# Patient Record
Sex: Male | Born: 1955
Health system: Southern US, Community
[De-identification: ages and names within clinical notes are randomized; demographics above are authoritative.]

## PROBLEM LIST (undated history)

## (undated) DIAGNOSIS — I10 Essential (primary) hypertension: Secondary | ICD-10-CM

## (undated) DIAGNOSIS — J40 Bronchitis, not specified as acute or chronic: Secondary | ICD-10-CM

## (undated) DIAGNOSIS — M1990 Unspecified osteoarthritis, unspecified site: Secondary | ICD-10-CM

## (undated) DIAGNOSIS — E785 Hyperlipidemia, unspecified: Secondary | ICD-10-CM

## (undated) DIAGNOSIS — E119 Type 2 diabetes mellitus without complications: Secondary | ICD-10-CM

## (undated) DIAGNOSIS — J45909 Unspecified asthma, uncomplicated: Secondary | ICD-10-CM

## (undated) DIAGNOSIS — I219 Acute myocardial infarction, unspecified: Secondary | ICD-10-CM

## (undated) DIAGNOSIS — Z720 Tobacco use: Secondary | ICD-10-CM

## (undated) DIAGNOSIS — Z86010 Personal history of colonic polyps: Principal | ICD-10-CM

## (undated) HISTORY — DX: Personal history of colonic polyps: Z86.010

## (undated) HISTORY — DX: Hyperlipidemia, unspecified: E78.5

## (undated) HISTORY — DX: Unspecified asthma, uncomplicated: J45.909

## (undated) HISTORY — DX: Type 2 diabetes mellitus without complications: E11.9

## (undated) HISTORY — PX: APPENDECTOMY: SHX54

## (undated) HISTORY — DX: Tobacco use: Z72.0

## (undated) HISTORY — PX: OTHER SURGICAL HISTORY: SHX169

## (undated) HISTORY — DX: Unspecified osteoarthritis, unspecified site: M19.90

---

## 1992-11-02 DIAGNOSIS — I219 Acute myocardial infarction, unspecified: Secondary | ICD-10-CM

## 1992-11-02 HISTORY — DX: Acute myocardial infarction, unspecified: I21.9

## 2008-09-07 HISTORY — PX: COLONOSCOPY: SHX174

## 2013-03-20 ENCOUNTER — Encounter (HOSPITAL_COMMUNITY): Payer: Self-pay | Admitting: Emergency Medicine

## 2013-03-20 ENCOUNTER — Emergency Department (HOSPITAL_COMMUNITY): Payer: Medicare (Managed Care)

## 2013-03-20 ENCOUNTER — Emergency Department (HOSPITAL_COMMUNITY)
Admission: EM | Admit: 2013-03-20 | Discharge: 2013-03-20 | Disposition: A | Discharge status: Home / Self Care | Payer: Medicare (Managed Care) | Attending: Emergency Medicine | Admitting: Emergency Medicine

## 2013-03-20 DIAGNOSIS — E119 Type 2 diabetes mellitus without complications: Secondary | ICD-10-CM | POA: Insufficient documentation

## 2013-03-20 DIAGNOSIS — F17200 Nicotine dependence, unspecified, uncomplicated: Secondary | ICD-10-CM | POA: Insufficient documentation

## 2013-03-20 DIAGNOSIS — R059 Cough, unspecified: Secondary | ICD-10-CM | POA: Insufficient documentation

## 2013-03-20 DIAGNOSIS — I252 Old myocardial infarction: Secondary | ICD-10-CM | POA: Insufficient documentation

## 2013-03-20 DIAGNOSIS — I10 Essential (primary) hypertension: Secondary | ICD-10-CM | POA: Insufficient documentation

## 2013-03-20 DIAGNOSIS — R05 Cough: Secondary | ICD-10-CM | POA: Insufficient documentation

## 2013-03-20 DIAGNOSIS — R079 Chest pain, unspecified: Secondary | ICD-10-CM | POA: Insufficient documentation

## 2013-03-20 DIAGNOSIS — Z8709 Personal history of other diseases of the respiratory system: Secondary | ICD-10-CM | POA: Insufficient documentation

## 2013-03-20 HISTORY — DX: Type 2 diabetes mellitus without complications: E11.9

## 2013-03-20 HISTORY — DX: Essential (primary) hypertension: I10

## 2013-03-20 HISTORY — DX: Acute myocardial infarction, unspecified: I21.9

## 2013-03-20 HISTORY — DX: Bronchitis, not specified as acute or chronic: J40

## 2013-03-20 LAB — COMPREHENSIVE METABOLIC PANEL
AST: 26 U/L (ref 0–37)
Albumin: 3.8 g/dL (ref 3.5–5.2)
Calcium: 9.3 mg/dL (ref 8.4–10.5)
Chloride: 104 mEq/L (ref 96–112)
Creatinine, Ser: 1.22 mg/dL (ref 0.50–1.35)
Total Bilirubin: 0.3 mg/dL (ref 0.3–1.2)
Total Protein: 6.7 g/dL (ref 6.0–8.3)

## 2013-03-20 LAB — POCT I-STAT TROPONIN I

## 2013-03-20 LAB — PRO B NATRIURETIC PEPTIDE: Pro B Natriuretic peptide (BNP): 10.4 pg/mL (ref 0–125)

## 2013-03-20 LAB — CBC
Hemoglobin: 14.1 g/dL (ref 13.0–17.0)
MCHC: 35.2 g/dL (ref 30.0–36.0)

## 2013-03-20 LAB — PROTIME-INR
INR: 0.94 (ref 0.00–1.49)
Prothrombin Time: 12.5 seconds (ref 11.6–15.2)

## 2013-03-20 MED ORDER — ASPIRIN 81 MG PO CHEW
324.0000 mg | CHEWABLE_TABLET | Freq: Once | ORAL | Status: AC
  Administered 2013-03-20: 324 mg via ORAL
  Filled 2013-03-20: qty 4

## 2013-03-20 MED ORDER — PREDNISONE 20 MG PO TABS
60.0000 mg | ORAL_TABLET | ORAL | Status: AC
  Administered 2013-03-20: 60 mg via ORAL
  Filled 2013-03-20: qty 3

## 2013-03-20 MED ORDER — PREDNISONE 20 MG PO TABS: 60.0000 mg | ORAL_TABLET | Freq: Every day | ORAL | Status: DC

## 2013-03-20 NOTE — ED Provider Notes (Signed)
History     CSN: [id number]  Arrival date & time 03/20/13  1145   First MD Initiated Contact with Patient 03/20/13 1151      Chief Complaint  Patient presents with  . Chest Pain    (Consider location/radiation/quality/duration/timing/severity/associated sxs/prior treatment) HPI Patient presents with episodic chest pain for 3 days. Also was at rest 3 days ago.  Since onset has been intermittent, left-sided, sharp, without clear precipitant or alleviating factors. The pain radiates to the left neck, left arm. There is no associated exertional or pleuritic pain There is no associated dyspnea, lightheadedness, nausea, vomiting, diarrhea, fever, chills. The patient is mild cough. Patient smokes daily. He is counseled to stop smoking. He states that he has a history of 2 prior MI, though no prior catheterization. This occurred in [place].    Past Medical History  Diagnosis Date  . Hypertension   . Diabetes mellitus without complication   . Acute MI   . Bronchitis     Past Surgical History  Procedure Laterality Date  . Appendectomy    . Cyst removed off back      No family history on file.  History  Substance Use Topics  . Smoking status: Current Every Day Smoker    Types: Cigarettes  . Smokeless tobacco: Not on file  . Alcohol Use: Yes     Comment: occasionall beer      Review of Systems  Constitutional:       Per HPI, otherwise negative  HENT:       Per HPI, otherwise negative  Respiratory:       Per HPI, otherwise negative  Cardiovascular:       Per HPI, otherwise negative  Gastrointestinal: Negative for vomiting.  Endocrine:       Negative aside from HPI  Genitourinary:       Neg aside from HPI   Musculoskeletal:       Per HPI, otherwise negative  Skin: Negative.   Neurological: Negative for syncope.    Allergies  Review of patient's allergies indicates no known allergies.  Home Medications  No current outpatient prescriptions on  file.  BP 150/93  Pulse 69  Temp(Src) 97.6 F (36.4 C) (Oral)  Resp 24  SpO2 99%  Physical Exam  Nursing note and vitals reviewed. Constitutional: He is oriented to person, place, and time. He appears well-developed. No distress.  HENT:  Head: Normocephalic and atraumatic.  Eyes: Conjunctivae and EOM are normal.  Cardiovascular: Normal rate and regular rhythm.   Pulmonary/Chest: Effort normal. No stridor. No respiratory distress.  Abdominal: He exhibits no distension.  Musculoskeletal: He exhibits no edema.  Neurological: He is alert and oriented to person, place, and time.  Skin: Skin is warm and dry.  Psychiatric: He has a normal mood and affect.    ED Course  Procedures (including critical care time)  Labs Reviewed  CBC  PROTIME-INR  PRO B NATRIURETIC PEPTIDE  COMPREHENSIVE METABOLIC PANEL  POCT I-STAT TROPONIN I   No results found.   No diagnosis found.  Pulse ox 99% room air normal   Date: 03/20/2013  Rate: 68  Rhythm: normal sinus rhythm  QRS Axis: normal  Intervals: normal  ST/T Wave abnormalities: normal  Conduction Disutrbances: none  Narrative Interpretation: unremarkable  Cardiac: 65sr, normal  3:09 PM Patient home.  Vital signs remained stable.  Reviewed labs thus far, x-ray with the patient.  I reiterated the importance of smoking cessation to  MDM  This patient  presents with days of intermittent chest pain, mild cough.  On exam is awake and alert, hemodynamically stable, in no distress.  The patient's evaluation was largely reassuring, including nonischemic ECG, normal troponin, which was his description of days of chest pain likely reflects the absence of acute coronary ischemia. Ago the patient describes a history of MI on further review, he he states that he was told he may have had a mild heart attack in the past, but has no history of catheterization, nor stress test. On multiple repeat evaluation the patient remained in no distress, awake  alert, laughing. Given his absence of distress, the reassuring evaluation today, the patient does require cardiac evaluation, but this is appropriate to be done as an outpatient.  I have spoken with our cardiology team, who will contact the patient to arrange followup. With x-ray findings, the patient was treated presumptively for pulmonary cause of his pain.        Gerhard Munch, MD 03/20/13 [phone]

## 2013-03-20 NOTE — ED Notes (Signed)
Pt states that he has pain in left chest area that radiates up neck to left side of head that has been going on for three days but getting more frequent and more intense.  Pt has PMH 2 MIs

## 2013-09-24 ENCOUNTER — Encounter (HOSPITAL_COMMUNITY): Payer: Self-pay | Admitting: Emergency Medicine

## 2013-09-24 ENCOUNTER — Emergency Department (HOSPITAL_COMMUNITY)
Admission: EM | Admit: 2013-09-24 | Discharge: 2013-09-24 | Disposition: A | Discharge status: Home / Self Care | Payer: Medicare Other | Attending: Emergency Medicine | Admitting: Emergency Medicine

## 2013-09-24 DIAGNOSIS — Y939 Activity, unspecified: Secondary | ICD-10-CM | POA: Insufficient documentation

## 2013-09-24 DIAGNOSIS — Z8709 Personal history of other diseases of the respiratory system: Secondary | ICD-10-CM | POA: Insufficient documentation

## 2013-09-24 DIAGNOSIS — S0502XA Injury of conjunctiva and corneal abrasion without foreign body, left eye, initial encounter: Secondary | ICD-10-CM

## 2013-09-24 DIAGNOSIS — F17200 Nicotine dependence, unspecified, uncomplicated: Secondary | ICD-10-CM | POA: Insufficient documentation

## 2013-09-24 DIAGNOSIS — I10 Essential (primary) hypertension: Secondary | ICD-10-CM | POA: Insufficient documentation

## 2013-09-24 DIAGNOSIS — X58XXXA Exposure to other specified factors, initial encounter: Secondary | ICD-10-CM | POA: Insufficient documentation

## 2013-09-24 DIAGNOSIS — IMO0002 Reserved for concepts with insufficient information to code with codable children: Secondary | ICD-10-CM | POA: Insufficient documentation

## 2013-09-24 DIAGNOSIS — Y929 Unspecified place or not applicable: Secondary | ICD-10-CM | POA: Insufficient documentation

## 2013-09-24 DIAGNOSIS — E119 Type 2 diabetes mellitus without complications: Secondary | ICD-10-CM | POA: Insufficient documentation

## 2013-09-24 DIAGNOSIS — Y990 Civilian activity done for income or pay: Secondary | ICD-10-CM | POA: Insufficient documentation

## 2013-09-24 DIAGNOSIS — Z79899 Other long term (current) drug therapy: Secondary | ICD-10-CM | POA: Insufficient documentation

## 2013-09-24 DIAGNOSIS — I252 Old myocardial infarction: Secondary | ICD-10-CM | POA: Insufficient documentation

## 2013-09-24 DIAGNOSIS — S058X9A Other injuries of unspecified eye and orbit, initial encounter: Secondary | ICD-10-CM | POA: Insufficient documentation

## 2013-09-24 MED ORDER — TETRACAINE HCL 0.5 % OP SOLN
1.0000 [drp] | Freq: Once | OPHTHALMIC | Status: AC
  Administered 2013-09-24: 1 [drp] via OPHTHALMIC
  Filled 2013-09-24: qty 2

## 2013-09-24 MED ORDER — POLYMYXIN B-TRIMETHOPRIM 10000-0.1 UNIT/ML-% OP SOLN: 1.0000 [drp] | OPHTHALMIC | Status: DC

## 2013-09-24 MED ORDER — POLYMYXIN B-TRIMETHOPRIM 10000-0.1 UNIT/ML-% OP SOLN
1.0000 [drp] | OPHTHALMIC | Status: DC
  Administered 2013-09-24: 1 [drp] via OPHTHALMIC
  Filled 2013-09-24: qty 10

## 2013-09-24 MED ORDER — HYDROCODONE-ACETAMINOPHEN 5-325 MG PO TABS: 2.0000 | ORAL_TABLET | Freq: Four times a day (QID) | ORAL | Status: DC | PRN

## 2013-09-24 MED ORDER — FLUORESCEIN SODIUM 1 MG OP STRP
1.0000 | ORAL_STRIP | Freq: Once | OPHTHALMIC | Status: AC
  Administered 2013-09-24: 1 via OPHTHALMIC
  Filled 2013-09-24: qty 1

## 2013-09-24 NOTE — ED Provider Notes (Signed)
CSN: [id number]     Arrival date & time 09/24/13  1605 History   First MD Initiated Contact with Patient 09/24/13 1630      This chart was scribed for non-physician practitioner, Roxy Horseman PA-C, working with Roney Marion, MD by Arlan Organ, ED Scribe. This patient was seen in room TR04C/TR04C and the patient's care was started at 4:45 PM.   Chief Complaint  Patient presents with  . Conjunctivitis   The history is provided by the patient. No language interpreter was used.   HPI Comments: Isaac Contreras is a 57 y.o. male who presents to the Emergency Department complaining of sudden onset, gradually worsening, constant left eye pain that started last night. Pt reports itching, blurred vision, and green discharge to the effected eye. He states he is unable to keep his left eye open without severe discomfort. 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 Pt states he was told all fragments were removed after the incident. Recently pt went for an eye exam, and was told he still has a small piece of metal in his eye. He states he was told to follow up for treatment if the metal begins to give him difficulty. Pt states otherwise he is healthy.   Past Medical History  Diagnosis Date  . Hypertension   . Diabetes mellitus without complication   . Acute MI   . Bronchitis    Past Surgical History  Procedure Laterality Date  . Appendectomy    . Cyst removed off back     History reviewed. No pertinent family history. History  Substance Use Topics  . Smoking status: Current Every Day Smoker    Types: Cigarettes  . Smokeless tobacco: Not on file  . Alcohol Use: Yes     Comment: occasionall beer    Review of Systems A complete 10 system review of systems was obtained and all systems are negative except as noted in the HPI and PMH.    Allergies  Review of patient's allergies indicates no known allergies.  Home  Medications   Current Outpatient Rx  Name  Route  Sig  Dispense  Refill  . albuterol (PROVENTIL HFA;VENTOLIN HFA) 108 (90 BASE) MCG/ACT inhaler   Inhalation   Inhale 2 puffs into the lungs every 6 (six) hours as needed for wheezing.         Marland Kitchen amLODipine (NORVASC) 10 MG tablet   Oral   Take 10 mg by mouth daily.         . predniSONE (DELTASONE) 20 MG tablet   Oral   Take 3 tablets (60 mg total) by mouth daily.   12 tablet   0    Triage Vitals: BP 180/120  Pulse 80  Temp(Src) 97.9 F (36.6 C) (Oral)  Resp 16  Wt 226 lb 11.2 oz (102.83 kg)  SpO2 99%  Physical Exam  Nursing note and vitals reviewed. Constitutional: He is oriented to person, place, and time. He appears well-developed and well-nourished.  HENT:  Head: Normocephalic and atraumatic.  Eyes: EOM are normal.  Crusting to left side of left eye Corneal abrasion of left eye in center on fluorescein exam No observed metallic foreign body or other foreign body conjunctiva is injected and erythematous  VISUAL ACUITY TEST: Right near 20/100 Left near 20/100 Right Distance 20/30 Left Distance 20/70  Neck: Normal range of motion.  Cardiovascular: Normal rate.   Pulmonary/Chest: Effort  normal.  Musculoskeletal: Normal range of motion.  Neurological: He is alert and oriented to person, place, and time.  Skin: Skin is warm and dry.  Psychiatric: He has a normal mood and affect. His behavior is normal.    ED Course  Procedures (including critical care time)  DIAGNOSTIC STUDIES: Oxygen Saturation is 99% on RA, Normal by my interpretation.    COORDINATION OF CARE: 4:54 PM- Will order visual acuity test. Will give fluorescein ophthalmic strip and tetracaine.  Discussed treatment plan with pt at bedside and pt agreed to plan.    5:23 PM- Will consult with Dr. Roney Marion.  Labs Review Labs Reviewed - No data to display Imaging Review No results found.  EKG Interpretation   None       MDM   1.  Corneal abrasion, left, initial encounter    Patient with corneal abrasion. Will discharge home with ophthalmology followup. Patient discussed with Dr. Fayrene Fearing, who agrees with the plan. Dr. Fayrene Fearing also recommends eye patch.  Will treat with polytrim and norco.  Patient understands and agrees with plan.  I personally performed the services described in this documentation, which was scribed in my presence. The recorded information has been reviewed and is accurate.     Roxy Horseman, PA-C 09/24/13 1737

## 2013-09-24 NOTE — ED Notes (Signed)
Presents with left eye itching, pain, redness and drainage that began last night. He thought something was in eye and tried to stick finger in eye to get out and rinse with no relief. Left eye red and vision blurred.

## 2013-09-27 NOTE — ED Provider Notes (Signed)
Medical screening examination/treatment/procedure(s) were performed by non-physician practitioner and as supervising physician I was immediately available for consultation/collaboration.  EKG Interpretation   None         Roney Marion, MD 09/27/13 [phone]

## 2013-12-15 ENCOUNTER — Encounter (HOSPITAL_COMMUNITY): Payer: Self-pay | Admitting: Emergency Medicine

## 2013-12-15 ENCOUNTER — Emergency Department (HOSPITAL_COMMUNITY)
Admission: EM | Admit: 2013-12-15 | Discharge: 2013-12-15 | Disposition: A | Discharge status: Home / Self Care | Payer: Medicare Other | Attending: Emergency Medicine | Admitting: Emergency Medicine

## 2013-12-15 DIAGNOSIS — F17200 Nicotine dependence, unspecified, uncomplicated: Secondary | ICD-10-CM | POA: Insufficient documentation

## 2013-12-15 DIAGNOSIS — Z8709 Personal history of other diseases of the respiratory system: Secondary | ICD-10-CM | POA: Insufficient documentation

## 2013-12-15 DIAGNOSIS — L02212 Cutaneous abscess of back [any part, except buttock]: Secondary | ICD-10-CM

## 2013-12-15 DIAGNOSIS — L03319 Cellulitis of trunk, unspecified: Principal | ICD-10-CM

## 2013-12-15 DIAGNOSIS — L02219 Cutaneous abscess of trunk, unspecified: Secondary | ICD-10-CM | POA: Insufficient documentation

## 2013-12-15 DIAGNOSIS — I10 Essential (primary) hypertension: Secondary | ICD-10-CM | POA: Insufficient documentation

## 2013-12-15 DIAGNOSIS — Z79899 Other long term (current) drug therapy: Secondary | ICD-10-CM | POA: Insufficient documentation

## 2013-12-15 DIAGNOSIS — I252 Old myocardial infarction: Secondary | ICD-10-CM | POA: Insufficient documentation

## 2013-12-15 DIAGNOSIS — E119 Type 2 diabetes mellitus without complications: Secondary | ICD-10-CM | POA: Insufficient documentation

## 2013-12-15 MED ORDER — IBUPROFEN 600 MG PO TABS: 600.0000 mg | ORAL_TABLET | Freq: Four times a day (QID) | ORAL | Status: DC | PRN

## 2013-12-15 MED ORDER — HYDROCODONE-ACETAMINOPHEN 5-325 MG PO TABS: 1.0000 | ORAL_TABLET | Freq: Four times a day (QID) | ORAL | Status: DC | PRN

## 2013-12-15 NOTE — ED Notes (Signed)
Patient states had possible abscess come up on back about a week ago.   No drainage.

## 2013-12-15 NOTE — Discharge Instructions (Signed)
Try warm compresses to the wound. He can also do soapy water bath soaks several times a day. Keep the area clean and dry. He can apply topical antibiotic ointment twice a day. Followup with your Dr. for recheck in 2 days. Return if there is worsening and swelling, drainage.   Abscess Care After An abscess (also called a boil or furuncle) is an infected area that contains a collection of pus. Signs and symptoms of an abscess include pain, tenderness, redness, or hardness, or you may feel a moveable soft area under your skin. An abscess can occur anywhere in the body. The infection may spread to surrounding tissues causing cellulitis. A cut (incision) by the surgeon was made over your abscess and the pus was drained out. Gauze may have been packed into the space to provide a drain that will allow the cavity to heal from the inside outwards. The boil may be painful for 5 to 7 days. Most people with a boil do not have high fevers. Your abscess, if seen early, may not have localized, and may not have been lanced. If not, another appointment may be required for this if it does not get better on its own or with medications. HOME CARE INSTRUCTIONS   Only take over-the-counter or prescription medicines for pain, discomfort, or fever as directed by your caregiver.  When you bathe, soak and then remove gauze or iodoform packs at least daily or as directed by your caregiver. You may then wash the wound gently with mild soapy water. Repack with gauze or do as your caregiver directs. SEEK IMMEDIATE MEDICAL CARE IF:   You develop increased pain, swelling, redness, drainage, or bleeding in the wound site.  You develop signs of generalized infection including muscle aches, chills, fever, or a general ill feeling.  An oral temperature above 102 F (38.9 C) develops, not controlled by medication. See your caregiver for a recheck if you develop any of the symptoms described above. If medications (antibiotics) were  prescribed, take them as directed. Document Released: 05/07/2005 Document Revised: 01/11/2012 Document Reviewed: 01/02/2008 [facility] Patient Information 2014 [place].

## 2013-12-15 NOTE — ED Provider Notes (Signed)
CSN: [id number]     Arrival date & time 12/15/13  4098 History  This chart was scribed for non-physician practitioner Jeannett Senior, PA-C working with Alfonzo Feller, DO by Zettie Pho, ED Scribe. This patient was seen in room TR09C/TR09C and the patient's care was started at 10:18 AM.    Chief Complaint  Patient presents with  . Abscess   The history is provided by the patient. No language interpreter was used.   HPI Comments: Isaac Contreras is a 58 y.o. Male with a history of abscesses who presents to the Emergency Department complaining of a mildly painful abscess to the mid-back onset about 1.5 weeks ago. Patient states that the pain is exacerbated with touch/applied pressure. He reports applying a warm compress to the area yesterday at home without significant relief. He denies any drainage from the area, fever, chills. Patient has a history of HTN and DM.   Past Medical History  Diagnosis Date  . Hypertension   . Diabetes mellitus without complication   . Acute MI   . Bronchitis    Past Surgical History  Procedure Laterality Date  . Appendectomy    . Cyst removed off back     No family history on file. History  Substance Use Topics  . Smoking status: Current Every Day Smoker    Types: Cigarettes  . Smokeless tobacco: Not on file  . Alcohol Use: Yes     Comment: occasionall beer    Review of Systems  Constitutional: Negative for fever and chills.  Skin: Positive for wound (abscess).   Allergies  Review of patient's allergies indicates no known allergies.  Home Medications   Current Outpatient Rx  Name  Route  Sig  Dispense  Refill  . albuterol (PROVENTIL HFA;VENTOLIN HFA) 108 (90 BASE) MCG/ACT inhaler   Inhalation   Inhale 2 puffs into the lungs every 6 (six) hours as needed for wheezing.         Marland Kitchen amLODipine (NORVASC) 10 MG tablet   Oral   Take 10 mg by mouth daily.         Marland Kitchen HYDROcodone-acetaminophen (NORCO/VICODIN) 5-325 MG per tablet   Oral    Take 2 tablets by mouth every 6 (six) hours as needed.   13 tablet   0   . tetrahydrozoline 0.05 % ophthalmic solution   Both Eyes   Place 3 drops into both eyes daily as needed (for dry eyes).         . trimethoprim-polymyxin b (POLYTRIM) ophthalmic solution   Left Eye   Place 1 drop into the left eye every 4 (four) hours.   10 mL   0    Triage Vitals: Pulse 91  Resp 20  Wt 220 lb (99.791 kg)  SpO2 98%  Physical Exam  Nursing note and vitals reviewed. Constitutional: He is oriented to person, place, and time. He appears well-developed and well-nourished. No distress.  HENT:  Head: Normocephalic and atraumatic.  Eyes: Conjunctivae are normal.  Neck: Normal range of motion. Neck supple.  Pulmonary/Chest: Effort normal. No respiratory distress.  Abdominal: He exhibits no distension.  Musculoskeletal: Normal range of motion.  Neurological: He is alert and oriented to person, place, and time.  Skin: Skin is warm and dry. No erythema.  3 cm x 3 cm area of swelling and induration to the mid-back. No erythema or drainage. Mild tenderness to palpation.   Psychiatric: He has a normal mood and affect. His behavior is normal.  ED Course  Procedures (including critical care time)  DIAGNOSTIC STUDIES: Oxygen Saturation is 98% on room air, normal by my interpretation.    COORDINATION OF CARE: 10:20 AM- Will perform an incision and drainage of the area. Discussed treatment plan with patient at bedside and patient verbalized agreement.   10:30 AM- Performed incision and drainage. Will discharge patient with medication to manage symptoms. Return precautions given. Discussed treatment plan with patient at bedside and patient verbalized agreement.    INCISION AND DRAINAGE Performed by: Jeannett Senior, PA-C Consent: Verbal consent obtained. Risks and benefits: risks, benefits and alternatives were discussed Type: abscess Body area: mid-back  Anesthesia: local  infiltration Incision was made with a scalpel. Local anesthetic: lidocaine 2% without epinephrine Anesthetic total: 2 ml Complexity: simple Blunt dissection to break up loculations Drainage: purulent Drainage amount: moderate/significant Patient tolerance: Patient tolerated the procedure well with no immediate complications.     Labs Review Labs Reviewed - No data to display Imaging Review No results found.  EKG Interpretation   None       MDM   Final diagnoses:  Abscess of back     patient patient with superficial skin abscess to the mid back. Incised and drained with large amount of purulent drainage. There is no surrounding cellulitis or erythema. I do not think patient needs to be started on antibiotics. He is afebrile, nontoxic. I did not pack the wound. He is to do careful wound care at home, followup for recheck in 2 days. Will discharge home with ibuprofen and Norco for breakthrough pain for a day or 2.   Filed Vitals:   12/15/13 0953 12/15/13 1053  BP: 140/87   Pulse: 91   Temp:  97.8 F (36.6 C)  TempSrc: Oral Oral  Resp: 20   Weight: 220 lb (99.791 kg)   SpO2: 98%     I personally performed the services described in this documentation, which was scribed in my presence. The recorded information has been reviewed and is accurate.   Renold Genta, PA-C 12/15/13 1623

## 2013-12-15 NOTE — ED Notes (Signed)
Had to finish charting at nurses station.  This RN had coughing spell in patient room and had to excuse.

## 2013-12-15 NOTE — ED Notes (Signed)
Pt in c/o abscess to middle of back x1 week, denies drainage, history of same, tender to touch

## 2013-12-16 NOTE — ED Provider Notes (Signed)
Medical screening examination/treatment/procedure(s) were performed by non-physician practitioner and as supervising physician I was immediately available for consultation/collaboration.  EKG Interpretation   None         Alfonzo Feller, DO 12/16/13 1400

## 2014-01-16 ENCOUNTER — Encounter (HOSPITAL_COMMUNITY): Payer: Self-pay | Admitting: Emergency Medicine

## 2014-01-16 ENCOUNTER — Emergency Department (HOSPITAL_COMMUNITY)
Admission: EM | Admit: 2014-01-16 | Discharge: 2014-01-16 | Disposition: A | Discharge status: Home / Self Care | Payer: Medicare Other | Attending: Emergency Medicine | Admitting: Emergency Medicine

## 2014-01-16 ENCOUNTER — Emergency Department (HOSPITAL_COMMUNITY): Payer: Medicare Other

## 2014-01-16 DIAGNOSIS — R0789 Other chest pain: Secondary | ICD-10-CM | POA: Insufficient documentation

## 2014-01-16 DIAGNOSIS — F17200 Nicotine dependence, unspecified, uncomplicated: Secondary | ICD-10-CM | POA: Insufficient documentation

## 2014-01-16 DIAGNOSIS — I10 Essential (primary) hypertension: Secondary | ICD-10-CM | POA: Insufficient documentation

## 2014-01-16 DIAGNOSIS — Z8709 Personal history of other diseases of the respiratory system: Secondary | ICD-10-CM | POA: Insufficient documentation

## 2014-01-16 DIAGNOSIS — Z79899 Other long term (current) drug therapy: Secondary | ICD-10-CM | POA: Insufficient documentation

## 2014-01-16 DIAGNOSIS — R079 Chest pain, unspecified: Secondary | ICD-10-CM

## 2014-01-16 DIAGNOSIS — Z7982 Long term (current) use of aspirin: Secondary | ICD-10-CM | POA: Insufficient documentation

## 2014-01-16 DIAGNOSIS — R0602 Shortness of breath: Secondary | ICD-10-CM | POA: Insufficient documentation

## 2014-01-16 DIAGNOSIS — I252 Old myocardial infarction: Secondary | ICD-10-CM | POA: Insufficient documentation

## 2014-01-16 DIAGNOSIS — E119 Type 2 diabetes mellitus without complications: Secondary | ICD-10-CM | POA: Insufficient documentation

## 2014-01-16 DIAGNOSIS — E663 Overweight: Secondary | ICD-10-CM | POA: Insufficient documentation

## 2014-01-16 LAB — CBC WITH DIFFERENTIAL/PLATELET
BASOS ABS: 0.1 10*3/uL (ref 0.0–0.1)
Basophils Relative: 1 % (ref 0–1)
EOS ABS: 0.6 10*3/uL (ref 0.0–0.7)
Eosinophils Relative: 6 % — ABNORMAL HIGH (ref 0–5)
HCT: 39 % (ref 39.0–52.0)
Hemoglobin: 12.9 g/dL — ABNORMAL LOW (ref 13.0–17.0)
LYMPHS PCT: 36 % (ref 12–46)
Lymphs Abs: 3.7 10*3/uL (ref 0.7–4.0)
MCH: 28.9 pg (ref 26.0–34.0)
MCHC: 33.1 g/dL (ref 30.0–36.0)
MCV: 87.2 fL (ref 78.0–100.0)
Monocytes Absolute: 0.8 10*3/uL (ref 0.1–1.0)
Monocytes Relative: 8 % (ref 3–12)
NEUTROS PCT: 49 % (ref 43–77)
Neutro Abs: 5 10*3/uL (ref 1.7–7.7)
PLATELETS: 331 10*3/uL (ref 150–400)
RBC: 4.47 MIL/uL (ref 4.22–5.81)
RDW: 15 % (ref 11.5–15.5)
WBC: 10.1 10*3/uL (ref 4.0–10.5)

## 2014-01-16 LAB — BASIC METABOLIC PANEL
BUN: 16 mg/dL (ref 6–23)
CO2: 25 meq/L (ref 19–32)
Calcium: 9.4 mg/dL (ref 8.4–10.5)
Chloride: 105 mEq/L (ref 96–112)
Creatinine, Ser: 1.12 mg/dL (ref 0.50–1.35)
GFR calc Af Amer: 83 mL/min — ABNORMAL LOW (ref >90)
GFR, EST NON AFRICAN AMERICAN: 72 mL/min — AB (ref >90)
GLUCOSE: 95 mg/dL (ref 70–99)
POTASSIUM: 4.8 meq/L (ref 3.7–5.3)
SODIUM: 142 meq/L (ref 137–147)

## 2014-01-16 LAB — D-DIMER, QUANTITATIVE (NOT AT ARMC)

## 2014-01-16 LAB — TROPONIN I

## 2014-01-16 MED ORDER — ASPIRIN 81 MG PO CHEW
324.0000 mg | CHEWABLE_TABLET | Freq: Once | ORAL | Status: AC
  Administered 2014-01-16: 324 mg via ORAL
  Filled 2014-01-16: qty 4

## 2014-01-16 MED ORDER — NITROGLYCERIN 2 % TD OINT
1.0000 [in_us] | TOPICAL_OINTMENT | Freq: Once | TRANSDERMAL | Status: AC
  Administered 2014-01-16: 1 [in_us] via TOPICAL
  Filled 2014-01-16: qty 1

## 2014-01-16 NOTE — ED Notes (Signed)
Family at bedside. 

## 2014-01-16 NOTE — ED Notes (Signed)
Patient transported to X-ray 

## 2014-01-16 NOTE — Discharge Instructions (Signed)
Chest Pain (Nonspecific) °It is often hard to give a specific diagnosis for the cause of chest pain. There is always a chance that your pain could be related to something serious, such as a heart attack or a blood clot in the lungs. You need to follow up with your caregiver for further evaluation. °CAUSES  °· Heartburn. °· Pneumonia or bronchitis. °· Anxiety or stress. °· Inflammation around your heart (pericarditis) or lung (pleuritis or pleurisy). °· A blood clot in the lung. °· A collapsed lung (pneumothorax). It can develop suddenly on its own (spontaneous pneumothorax) or from injury (trauma) to the chest. °· Shingles infection (herpes zoster virus). °The chest wall is composed of bones, muscles, and cartilage. Any of these can be the source of the pain. °· The bones can be bruised by injury. °· The muscles or cartilage can be strained by coughing or overwork. °· The cartilage can be affected by inflammation and become sore (costochondritis). °DIAGNOSIS  °Lab tests or other studies, such as X-rays, electrocardiography, stress testing, or cardiac imaging, may be needed to find the cause of your pain.  °TREATMENT  °· Treatment depends on what may be causing your chest pain. Treatment may include: °· Acid blockers for heartburn. °· Anti-inflammatory medicine. °· Pain medicine for inflammatory conditions. °· Antibiotics if an infection is present. °· You may be advised to change lifestyle habits. This includes stopping smoking and avoiding alcohol, caffeine, and chocolate. °· You may be advised to keep your head raised (elevated) when sleeping. This reduces the chance of acid going backward from your stomach into your esophagus. °· Most of the time, nonspecific chest pain will improve within 2 to 3 days with rest and mild pain medicine. °HOME CARE INSTRUCTIONS  °· If antibiotics were prescribed, take your antibiotics as directed. Finish them even if you start to feel better. °· For the next few days, avoid physical  activities that bring on chest pain. Continue physical activities as directed. °· Do not smoke. °· Avoid drinking alcohol. °· Only take over-the-counter or prescription medicine for pain, discomfort, or fever as directed by your caregiver. °· Follow your caregiver's suggestions for further testing if your chest pain does not go away. °· Keep any follow-up appointments you made. If you do not go to an appointment, you could develop lasting (chronic) problems with pain. If there is any problem keeping an appointment, you must call to reschedule. °SEEK MEDICAL CARE IF:  °· You think you are having problems from the medicine you are taking. Read your medicine instructions carefully. °· Your chest pain does not go away, even after treatment. °· You develop a rash with blisters on your chest. °SEEK IMMEDIATE MEDICAL CARE IF:  °· You have increased chest pain or pain that spreads to your arm, neck, jaw, back, or abdomen. °· You develop shortness of breath, an increasing cough, or you are coughing up blood. °· You have severe back or abdominal pain, feel nauseous, or vomit. °· You develop severe weakness, fainting, or chills. °· You have a fever. °THIS IS AN EMERGENCY. Do not wait to see if the pain will go away. Get medical help at once. Call your local emergency services (911 in [place] Do not drive yourself to the hospital. °MAKE SURE YOU:  °· Understand these instructions. °· Will watch your condition. °· Will get help right away if you are not doing well or get worse. °Document Released: 07/29/2005 Document Revised: 01/11/2012 Document Reviewed: 05/24/2008 °[employer]® Patient Information ©2014 [employer]. ° °

## 2014-01-16 NOTE — ED Provider Notes (Signed)
CSN: [id number]     Arrival date & time 01/16/14  0807 History   First MD Initiated Contact with Patient 01/16/14 0813     Chief Complaint  Patient presents with  . Chest Pain     (Consider location/radiation/quality/duration/timing/severity/associated sxs/prior Treatment) HPI  This is a 58 year old male with history of hypertension, hyperlipidemia, diabetes and reported history of heart attack who presents for chest pain. Patient states that he was seen last year for similar complaints. He states that since that time he has continued to have some chest pain. He describes left-sided sharp chest pain that radiates upwards into his head. He reports a throbbing pain. He states the pain has recently been constant but is worse with exertion. He endorses associated shortness of breath. Currently his pain is 7/10.  He has not taken an aspirin today.  Patient also reports 2 month history of right back pain. It is worse with movement and lifting. It is positional. He denies any weakness, numbness, or tingling. He denies any difficulty with bowel or bladder.  Past Medical History  Diagnosis Date  . Hypertension   . Diabetes mellitus without complication   . Acute MI   . Bronchitis    Past Surgical History  Procedure Laterality Date  . Appendectomy    . Cyst removed off back     History reviewed. No pertinent family history. History  Substance Use Topics  . Smoking status: Current Every Day Smoker    Types: Cigarettes  . Smokeless tobacco: Not on file  . Alcohol Use: Yes     Comment: occasionall beer    Review of Systems  Constitutional: Negative.  Negative for fever.  Respiratory: Positive for chest tightness and shortness of breath. Negative for cough.   Cardiovascular: Positive for chest pain. Negative for leg swelling.  Gastrointestinal: Negative.  Negative for abdominal pain.  Genitourinary: Negative.  Negative for dysuria.  Musculoskeletal: Positive for back pain.  Skin:  Negative for rash.  Neurological: Negative for weakness, numbness and headaches.  All other systems reviewed and are negative.      Allergies  Review of patient's allergies indicates no known allergies.  Home Medications   Current Outpatient Rx  Name  Route  Sig  Dispense  Refill  . albuterol (PROVENTIL HFA;VENTOLIN HFA) 108 (90 BASE) MCG/ACT inhaler   Inhalation   Inhale 2 puffs into the lungs every 6 (six) hours as needed for wheezing.         Marland Kitchen amLODipine (NORVASC) 10 MG tablet   Oral   Take 10 mg by mouth daily.         Marland Kitchen aspirin 325 MG EC tablet   Oral   Take 325 mg by mouth daily.         Marland Kitchen guaiFENesin-dextromethorphan (ROBITUSSIN DM) 100-10 MG/5ML syrup   Oral   Take 15 mLs by mouth every 4 (four) hours as needed for cough.         Marland Kitchen HYDROcodone-acetaminophen (NORCO) 5-325 MG per tablet   Oral   Take 1 tablet by mouth every 6 (six) hours as needed for moderate pain.   10 tablet   0   . tetrahydrozoline 0.05 % ophthalmic solution   Both Eyes   Place 3 drops into both eyes daily as needed (for dry eyes).         . trimethoprim-polymyxin b (POLYTRIM) ophthalmic solution   Left Eye   Place 1 drop into the left eye every 4 (four) hours.  10 mL   0    BP 132/71  Pulse 71  Temp(Src) 97.8 F (36.6 C) (Oral)  Resp 20  Ht 5\' 8"  (1.727 m)  Wt 230 lb (104.327 kg)  BMI 34.98 kg/m2  SpO2 94% Physical Exam  Nursing note and vitals reviewed. Constitutional: He is oriented to person, place, and time. He appears well-developed and well-nourished. No distress.  overweight  HENT:  Head: Normocephalic and atraumatic.  Eyes: Pupils are equal, round, and reactive to light.  Neck: Neck supple.  Cardiovascular: Normal rate, regular rhythm and normal heart sounds.   No murmur heard. Pulmonary/Chest: Effort normal and breath sounds normal. No respiratory distress. He has no wheezes. He exhibits no tenderness.  Abdominal: Soft. Bowel sounds are normal. There  is no tenderness. There is no rebound.  Musculoskeletal: He exhibits no edema.  Lymphadenopathy:    He has no cervical adenopathy.  Neurological: He is alert and oriented to person, place, and time.  Skin: Skin is warm and dry.  Psychiatric: He has a normal mood and affect.    ED Course  Procedures (including critical care time) Labs Review Labs Reviewed  CBC WITH DIFFERENTIAL - Abnormal; Notable for the following:    Hemoglobin 12.9 (*)    Eosinophils Relative 6 (*)    All other components within normal limits  BASIC METABOLIC PANEL - Abnormal; Notable for the following:    GFR calc non Af Amer 72 (*)    GFR calc Af Amer 83 (*)    All other components within normal limits  TROPONIN I  D-DIMER, QUANTITATIVE   Imaging Review Dg Chest 2 View  01/16/2014   CLINICAL DATA:  Several month history of chest pain and shortness of breath  EXAM: CHEST  2 VIEW  COMPARISON:  DG CHEST 2 VIEW dated 03/20/2013  FINDINGS: The lungs are adequately inflated and clear. The cardiopericardial silhouette is normal in size. The pulmonary vascularity is not engorged. The mediastinum is normal in width. There is no pleural effusion. The The observed portions of the bony thorax exhibit no acute abnormalities.  IMPRESSION: There is no evidence of CHF nor pneumonia nor other acute cardiopulmonary disease.   Electronically Signed   By: Kaidan  Martinique   On: 01/16/2014 09:12     EKG Interpretation   Date/Time:  Tuesday January 16 2014 08:12:52 EDT Ventricular Rate:  71 PR Interval:  176 QRS Duration: 82 QT Interval:  390 QTC Calculation: 423 R Axis:   68 Text Interpretation:  Normal sinus rhythm Normal ECG Confirmed by Durk Carmen   MD, Loeta Herst ([id number]) on 01/16/2014 8:15:57 AM      MDM   Final diagnoses:  Chest pain    Patient presents with CP.  Somewhat atypical and prolonged duration but has risk factors for ACS and reports hx.  Exam reassuring.  HEart score 3.  W/U negative.  Dimer neg.  Discussed with  cardiology.  Will d/c home with close cards follow-up for possible stress testing. Patient given strict return precautions.  After history, exam, and medical workup I feel the patient has been appropriately medically screened and is safe for discharge home. Pertinent diagnoses were discussed with the patient. Patient was given return precautions.     Merryl Hacker, MD 01/16/14 [phone]

## 2014-01-16 NOTE — ED Notes (Signed)
MD at bedside. 

## 2014-01-16 NOTE — ED Notes (Signed)
Pt reports mid chest pains since November, has been seen for same in past and told it was inflammation. Reports its a pulsating pain to mid and left chest, radiates into his temporal area. ekg done at triage, no acute distress noted.

## 2014-02-02 ENCOUNTER — Encounter: Payer: Self-pay | Admitting: Cardiovascular Disease

## 2014-02-02 DIAGNOSIS — I219 Acute myocardial infarction, unspecified: Secondary | ICD-10-CM | POA: Insufficient documentation

## 2014-02-02 DIAGNOSIS — E119 Type 2 diabetes mellitus without complications: Secondary | ICD-10-CM | POA: Insufficient documentation

## 2014-02-02 DIAGNOSIS — J40 Bronchitis, not specified as acute or chronic: Secondary | ICD-10-CM | POA: Insufficient documentation

## 2014-02-02 DIAGNOSIS — I10 Essential (primary) hypertension: Secondary | ICD-10-CM | POA: Insufficient documentation

## 2014-02-05 ENCOUNTER — Ambulatory Visit (INDEPENDENT_AMBULATORY_CARE_PROVIDER_SITE_OTHER): Payer: Medicare Other | Admitting: Cardiovascular Disease

## 2014-02-05 ENCOUNTER — Encounter: Payer: Self-pay | Admitting: Cardiovascular Disease

## 2014-02-05 VITALS — BP 130/88 | HR 84 | Ht 68.0 in | Wt 229.8 lb

## 2014-02-05 DIAGNOSIS — R079 Chest pain, unspecified: Secondary | ICD-10-CM | POA: Diagnosis not present

## 2014-02-05 DIAGNOSIS — E119 Type 2 diabetes mellitus without complications: Secondary | ICD-10-CM

## 2014-02-05 DIAGNOSIS — I10 Essential (primary) hypertension: Secondary | ICD-10-CM

## 2014-02-05 DIAGNOSIS — R002 Palpitations: Secondary | ICD-10-CM

## 2014-02-05 DIAGNOSIS — R0600 Dyspnea, unspecified: Secondary | ICD-10-CM | POA: Insufficient documentation

## 2014-02-05 DIAGNOSIS — R0989 Other specified symptoms and signs involving the circulatory and respiratory systems: Secondary | ICD-10-CM

## 2014-02-05 DIAGNOSIS — R0609 Other forms of dyspnea: Secondary | ICD-10-CM

## 2014-02-05 MED ORDER — PROPRANOLOL HCL 10 MG PO TABS: 10.0000 mg | ORAL_TABLET | ORAL | Status: DC | PRN

## 2014-02-05 NOTE — Progress Notes (Signed)
Patient ID: Isaac Contreras, male   DOB: May 30, 1956, 58 y.o.   MRN: [id number]  58 yo seen in ER 3/17 for chest pain   History of hypertension, hyperlipidemia, diabetes and reported history of heart attack who presented to ER  With chest pain. Patient states that he was seen last year for similar complaints in [place] . He states that since that time he has continued to have some chest pain. He describes left-sided sharp chest pain that radiates upwards into his head. He reports a throbbing pain. He states the pain has recently been constant but is worse with exertion. He endorses associated shortness of breath. Currently his pain is 7/10. He has not taken an aspirin today. Patient also reports 2 month history of right back pain. It is worse with movement and lifting. It is positional. He denies any weakness, numbness, or tingling. He denies any difficulty with bowel or bladder.  In ER R/O  CXR and ECG normal  Continues to have atypical pain  Mild exertional dyspnea    ROS: Denies fever, malais, weight loss, blurry vision, decreased visual acuity, cough, sputum, SOB, hemoptysis, pleuritic pain, palpitaitons, heartburn, abdominal pain, melena, lower extremity edema, claudication, or rash.  All other systems reviewed and negative   General: Affect appropriate Healthy:  appears stated age 74: normal Neck supple with no adenopathy JVP normal no bruits no thyromegaly Lungs clear with no wheezing and good diaphragmatic motion Heart:  S1/S2 no murmur,rub, gallop or click PMI normal Abdomen: benighn, BS positve, no tenderness, no AAA no bruit.  No HSM or HJR Distal pulses intact with no bruits No edema Neuro non-focal Skin warm and dry No muscular weakness  Medications Current Outpatient Prescriptions  Medication Sig Dispense Refill  . albuterol (PROVENTIL HFA;VENTOLIN HFA) 108 (90 BASE) MCG/ACT inhaler Inhale 2 puffs into the lungs every 6 (six) hours as needed for wheezing.      Marland Kitchen amLODipine  (NORVASC) 10 MG tablet Take 10 mg by mouth daily.      Marland Kitchen aspirin 325 MG EC tablet Take 325 mg by mouth daily.      Marland Kitchen guaiFENesin-dextromethorphan (ROBITUSSIN DM) 100-10 MG/5ML syrup Take 15 mLs by mouth every 4 (four) hours as needed for cough.      Marland Kitchen HYDROcodone-acetaminophen (NORCO) 5-325 MG per tablet Take 1 tablet by mouth every 6 (six) hours as needed for moderate pain.  10 tablet  0  . tetrahydrozoline 0.05 % ophthalmic solution Place 3 drops into both eyes daily as needed (for dry eyes).      . trimethoprim-polymyxin b (POLYTRIM) ophthalmic solution Place 1 drop into the left eye every 4 (four) hours.  10 mL  0   No current facility-administered medications for this visit.    Allergies Review of patient's allergies indicates no known allergies.  Family History: No family history on file.  Social History: History   Social History  . Marital Status: Single    Spouse Name: N/A    Number of Children: N/A  . Years of Education: N/A   Occupational History  . Not on file.   Social History Main Topics  . Smoking status: Current Every Day Smoker    Types: Cigarettes  . Smokeless tobacco: Not on file  . Alcohol Use: Yes     Comment: occasionall beer  . Drug Use: No  . Sexual Activity: Not on file   Other Topics Concern  . Not on file   Social History Narrative  . No narrative on  file    Electrocardiogram: NSR rate 72 normal   Assessment and Plan

## 2014-02-05 NOTE — Assessment & Plan Note (Signed)
Atypical multiple risk factors  Normal ECG  F/U ETT

## 2014-02-05 NOTE — Assessment & Plan Note (Signed)
Discussed low carb diet.  Target hemoglobin A1c is 6.5 or less.  Continue current medications.  

## 2014-02-05 NOTE — Assessment & Plan Note (Signed)
Well controlled.  Continue current medications and low sodium Dash type diet.    

## 2014-02-05 NOTE — Assessment & Plan Note (Signed)
Normal cardiopulm exam  Normal CXR  F/U echo to assess RV and LV function

## 2014-02-05 NOTE — Patient Instructions (Signed)
Your physician recommends that you schedule a follow-up appointment in: [place] physician has recommended you make the following change in your medication: PROPANOLOL   10 MG   AS  NEEDED FOR  PALPITATIONS  Your physician has requested that you have an exercise tolerance test. For further information please visit [email or web address]. Please also follow instruction sheet, as given.  Your physician has requested that you have an echocardiogram. Echocardiography is a painless test that uses sound waves to create images of your heart. It provides your doctor with information about the size and shape of your heart and how well your heart's chambers and valves are working. This procedure takes approximately one hour. There are no restrictions for this procedure.

## 2014-03-13 ENCOUNTER — Ambulatory Visit (INDEPENDENT_AMBULATORY_CARE_PROVIDER_SITE_OTHER): Payer: Medicare Other | Admitting: Nurse Practitioner

## 2014-03-13 ENCOUNTER — Encounter: Payer: Self-pay | Admitting: Nurse Practitioner

## 2014-03-13 ENCOUNTER — Telehealth: Payer: Self-pay | Admitting: *Deleted

## 2014-03-13 ENCOUNTER — Ambulatory Visit (HOSPITAL_COMMUNITY): Payer: Medicare Other | Attending: Cardiovascular Disease | Admitting: Radiology

## 2014-03-13 VITALS — BP 132/85 | HR 71

## 2014-03-13 DIAGNOSIS — R079 Chest pain, unspecified: Secondary | ICD-10-CM

## 2014-03-13 DIAGNOSIS — R002 Palpitations: Secondary | ICD-10-CM

## 2014-03-13 DIAGNOSIS — R0609 Other forms of dyspnea: Secondary | ICD-10-CM | POA: Insufficient documentation

## 2014-03-13 DIAGNOSIS — R0989 Other specified symptoms and signs involving the circulatory and respiratory systems: Principal | ICD-10-CM | POA: Insufficient documentation

## 2014-03-13 NOTE — Progress Notes (Signed)
Exercise Treadmill Test  Pre-Exercise Testing Evaluation Rhythm: normal sinus  Rate: 64 bpm     Test  Exercise Tolerance Test Ordering MD: Jenkins Rouge, MD  Interpreting MD: Truitt Merle, NP  Unique Test No: 1  Treadmill:  1  Indication for ETT: chest pain - rule out ischemia  Contraindication to ETT: No   Stress Modality: exercise - treadmill  Cardiac Imaging Performed: non   Protocol: standard Bruce - maximal  Max BP:  209/78  Max MPHR (bpm):  164 85% MPR (bpm):  139  MPHR obtained (bpm):  146 % MPHR obtained:  89%  Reached 85% MPHR (min:sec):  9:17 Total Exercise Time (min-sec):  10 minutes  Workload in METS:  11.4 Borg Scale: 17  Reason ETT Terminated:  patient's desire to stop    ST Segment Analysis At Rest: normal ST segments - no evidence of significant ST depression With Exercise: no evidence of significant ST depression  Other Information Arrhythmia:  No Angina during ETT:  absent (0) Quality of ETT:  diagnostic  ETT Interpretation:  normal - no evidence of ischemia by ST analysis  Comments: Patient presents today for routine GXT. Has had atypical chest pain. Multiple CV risk factors with HTN, HLD and ongoing tobacco abuse. Reports prior MI - no details known.  Today the patient exercised on the standard Bruce protocol for a total of 10 minutes.  Good exercise tolerance.  Adequate blood pressure response.  Clinically negative for chest pain. Test was stopped due to achievement of target HR/fatigue.  EKG negative for ischemia. No significant arrhythmia noted. One couplet at peak of exercise.   Recommendations: CV risk factor modification.  Echo results pending.  Patient is agreeable to this plan and will call if any problems develop in the interim.   Burtis Junes, RN, [place] [address] [place], [place]  [id number] [phone]

## 2014-03-13 NOTE — Progress Notes (Signed)
Normal ETT 

## 2014-03-13 NOTE — Progress Notes (Signed)
Echocardiogram performed.  

## 2014-03-13 NOTE — Telephone Encounter (Signed)
LEFT MESSAGE  TO  CALL BACK./CY 

## 2014-03-13 NOTE — Telephone Encounter (Signed)
Message copied by Richmond Campbell on Tue Mar 13, 2014  4:29 PM ------      Message from: Josue Hector      Created: Tue Mar 13, 2014  2:22 PM                   ----- Message -----         From: Burtis Junes, NP         Sent: 03/13/2014  12:03 PM           To: Josue Hector, MD             ------

## 2014-03-14 ENCOUNTER — Telehealth: Payer: Self-pay | Admitting: Cardiovascular Disease

## 2014-03-14 NOTE — Telephone Encounter (Signed)
New problem   Pt was told by Altha Harm to call her after his stress test on yesterday

## 2014-03-14 NOTE — Telephone Encounter (Signed)
Notified of echo results.

## 2014-05-02 DIAGNOSIS — E119 Type 2 diabetes mellitus without complications: Secondary | ICD-10-CM | POA: Diagnosis not present

## 2014-05-02 DIAGNOSIS — I10 Essential (primary) hypertension: Secondary | ICD-10-CM | POA: Diagnosis not present

## 2014-05-02 DIAGNOSIS — M539 Dorsopathy, unspecified: Secondary | ICD-10-CM | POA: Diagnosis not present

## 2014-10-15 ENCOUNTER — Emergency Department (INDEPENDENT_AMBULATORY_CARE_PROVIDER_SITE_OTHER)
Admission: EM | Admit: 2014-10-15 | Discharge: 2014-10-15 | Disposition: A | Discharge status: Home / Self Care | Payer: Medicare Other | Source: Home / Self Care | Attending: Family Medicine | Admitting: Family Medicine

## 2014-10-15 ENCOUNTER — Encounter (HOSPITAL_COMMUNITY): Payer: Self-pay | Admitting: Emergency Medicine

## 2014-10-15 ENCOUNTER — Emergency Department (INDEPENDENT_AMBULATORY_CARE_PROVIDER_SITE_OTHER): Payer: Self-pay

## 2014-10-15 DIAGNOSIS — M1310 Monoarthritis, not elsewhere classified, unspecified site: Secondary | ICD-10-CM

## 2014-10-15 DIAGNOSIS — M1000 Idiopathic gout, unspecified site: Secondary | ICD-10-CM

## 2014-10-15 DIAGNOSIS — M25422 Effusion, left elbow: Secondary | ICD-10-CM

## 2014-10-15 DIAGNOSIS — M25522 Pain in left elbow: Secondary | ICD-10-CM

## 2014-10-15 DIAGNOSIS — M19022 Primary osteoarthritis, left elbow: Secondary | ICD-10-CM | POA: Diagnosis not present

## 2014-10-15 LAB — CBC WITH DIFFERENTIAL/PLATELET
BASOS ABS: 0.1 10*3/uL (ref 0.0–0.1)
Basophils Relative: 1 % (ref 0–1)
Eosinophils Absolute: 0.5 10*3/uL (ref 0.0–0.7)
Eosinophils Relative: 5 % (ref 0–5)
HEMATOCRIT: 42.4 % (ref 39.0–52.0)
Hemoglobin: 14.4 g/dL (ref 13.0–17.0)
LYMPHS PCT: 34 % (ref 12–46)
Lymphs Abs: 3.5 10*3/uL (ref 0.7–4.0)
MCH: 30 pg (ref 26.0–34.0)
MCHC: 34 g/dL (ref 30.0–36.0)
MCV: 88.3 fL (ref 78.0–100.0)
MONO ABS: 0.7 10*3/uL (ref 0.1–1.0)
Monocytes Relative: 7 % (ref 3–12)
Neutro Abs: 5.5 10*3/uL (ref 1.7–7.7)
Neutrophils Relative %: 53 % (ref 43–77)
Platelets: 347 10*3/uL (ref 150–400)
RBC: 4.8 MIL/uL (ref 4.22–5.81)
RDW: 14.4 % (ref 11.5–15.5)
WBC: 10.2 10*3/uL (ref 4.0–10.5)

## 2014-10-15 LAB — URIC ACID: URIC ACID, SERUM: 7.4 mg/dL (ref 4.0–7.8)

## 2014-10-15 MED ORDER — COLCHICINE 0.6 MG PO TABS: 0.6000 mg | ORAL_TABLET | Freq: Two times a day (BID) | ORAL | Status: DC

## 2014-10-15 NOTE — ED Notes (Signed)
Pt complains of pain in left elbow and decreased ROM from 6 days ago.  He states it is getting a little better today.

## 2014-10-15 NOTE — Discharge Instructions (Signed)
Gout °Gout is an inflammatory arthritis caused by a buildup of uric acid crystals in the joints. Uric acid is a chemical that is normally present in the blood. When the level of uric acid in the blood is too high it can form crystals that deposit in your joints and tissues. This causes joint redness, soreness, and swelling (inflammation). Repeat attacks are common. Over time, uric acid crystals can form into masses (tophi) near a joint, destroying bone and causing disfigurement. Gout is treatable and often preventable. °CAUSES  °The disease begins with elevated levels of uric acid in the blood. Uric acid is produced by your body when it breaks down a naturally found substance called purines. Certain foods you eat, such as meats and fish, contain high amounts of purines. Causes of an elevated uric acid level include: °· Being passed down from parent to child (heredity). °· Diseases that cause increased uric acid production (such as obesity, psoriasis, and certain cancers). °· Excessive alcohol use. °· Diet, especially diets rich in meat and seafood. °· Medicines, including certain cancer-fighting medicines (chemotherapy), water pills (diuretics), and aspirin. °· Chronic kidney disease. The kidneys are no longer able to remove uric acid well. °· Problems with metabolism. °Conditions strongly associated with gout include: °· Obesity. °· High blood pressure. °· High cholesterol. °· Diabetes. °Not everyone with elevated uric acid levels gets gout. It is not understood why some people get gout and others do not. Surgery, joint injury, and eating too much of certain foods are some of the factors that can lead to gout attacks. °SYMPTOMS  °· An attack of gout comes on quickly. It causes intense pain with redness, swelling, and warmth in a joint. °· Fever can occur. °· Often, only one joint is involved. Certain joints are more commonly involved: °· Base of the big toe. °· Knee. °· Ankle. °· Wrist. °· Finger. °Without  treatment, an attack usually goes away in a few days to weeks. Between attacks, you usually will not have symptoms, which is different from many other forms of arthritis. °DIAGNOSIS  °Your caregiver will suspect gout based on your symptoms and exam. In some cases, tests may be recommended. The tests may include: °· Blood tests. °· Urine tests. °· X-rays. °· Joint fluid exam. This exam requires a needle to remove fluid from the joint (arthrocentesis). Using a microscope, gout is confirmed when uric acid crystals are seen in the joint fluid. °TREATMENT  °There are two phases to gout treatment: treating the sudden onset (acute) attack and preventing attacks (prophylaxis). °· Treatment of an Acute Attack. °· Medicines are used. These include anti-inflammatory medicines or steroid medicines. °· An injection of steroid medicine into the affected joint is sometimes necessary. °· The painful joint is rested. Movement can worsen the arthritis. °· You may use warm or cold treatments on painful joints, depending which works best for you. °· Treatment to Prevent Attacks. °· If you suffer from frequent gout attacks, your caregiver may advise preventive medicine. These medicines are started after the acute attack subsides. These medicines either help your kidneys eliminate uric acid from your body or decrease your uric acid production. You may need to stay on these medicines for a very long time. °· The early phase of treatment with preventive medicine can be associated with an increase in acute gout attacks. For this reason, during the first few months of treatment, your caregiver may also advise you to take medicines usually used for acute gout treatment. Be sure you   understand your caregiver's directions. Your caregiver may make several adjustments to your medicine dose before these medicines are effective.  Discuss dietary treatment with your caregiver or dietitian. Alcohol and drinks high in sugar and fructose and foods  such as meat, poultry, and seafood can increase uric acid levels. Your caregiver or dietitian can advise you on drinks and foods that should be limited. HOME CARE INSTRUCTIONS   Do not take aspirin to relieve pain. This raises uric acid levels.  Only take over-the-counter or prescription medicines for pain, discomfort, or fever as directed by your caregiver.  Rest the joint as much as possible. When in bed, keep sheets and blankets off painful areas.  Keep the affected joint raised (elevated).  Apply warm or cold treatments to painful joints. Use of warm or cold treatments depends on which works best for you.  Use crutches if the painful joint is in your leg.  Drink enough fluids to keep your urine clear or pale yellow. This helps your body get rid of uric acid. Limit alcohol, sugary drinks, and fructose drinks.  Follow your dietary instructions. Pay careful attention to the amount of protein you eat. Your daily diet should emphasize fruits, vegetables, whole grains, and fat-free or low-fat milk products. Discuss the use of coffee, vitamin C, and cherries with your caregiver or dietitian. These may be helpful in lowering uric acid levels.  Maintain a healthy body weight. SEEK MEDICAL CARE IF:   You develop diarrhea, vomiting, or any side effects from medicines.  You do not feel better in 24 hours, or you are getting worse. SEEK IMMEDIATE MEDICAL CARE IF:   Your joint becomes suddenly more tender, and you have chills or a fever. MAKE SURE YOU:   Understand these instructions.  Will watch your condition.  Will get help right away if you are not doing well or get worse. Document Released: 10/16/2000 Document Revised: 03/05/2014 Document Reviewed: 06/01/2012 [facility] Patient Information 2015 [place], [place]. This information is not intended to replace advice given to you by your health care provider. Make sure you discuss any questions you have with your health care provider.  Elbow  Effusion You have an elbow injury with an effusion. This means there is blood or other fluid in the elbow joint. Both fractures and sprains of the elbow cab cause an effusion with swelling and pain. X-rays often show this swelling around the joint, but they may not show a fracture. The treatment for elbow sprains and minor fractures is to reduce swelling and pain. It rests the joint until movement is painless. Repeating the x-ray study in 1-2 weeks may show a minor fracture of the radius bone that was not visible on the initial x-rays. Most of the time a splint or sling is used for the first days or week after the injury. Apply ice packs to the elbow for 20-30 minutes every 2 hours for the next 2-3 days. Keep your elbow elevated above the level of your heart as much as possible until the pain and swelling are better. An elastic wrap may also be used to reduce swelling. Call your caregiver for follow-up care within one week.  The major issue with this condition is loss of elbow motion. In general, your caregiver will start you on motion exercises and may have you follow-up with a physical or hand therapist. [place] IF:   You develop a numb, cold, or pale forearm or hand. Document Released: 11/26/2004 Document Revised: 01/11/2012 Document Reviewed: 04/16/2009  [employer] Patient Information 2015 [place]. This information is not intended to replace advice given to you by your health care provider. Make sure you discuss any questions you have with your health care provider.  Elbow Exercises EXERCISES RANGE OF MOTION (ROM) AND STRETCHING EXERCISES  These exercises may help you when beginning to rehabilitate your injury. Your symptoms may go away with or without further involvement from your physician, physical therapist or athletic trainer. While completing these exercises, remember:   Restoring tissue flexibility helps normal motion to return to the joints. This allows healthier, less  painful movement and activity.  An effective stretch should be held for at least 30 seconds.  A stretch should never be painful. You should only feel a gentle lengthening or release in the stretched tissue. RANGE OF MOTION - Extension  Hold your right / left arm at your side and straighten your elbow as far as you can, using your right / left arm muscles.  Straighten the elbow farther by gently pushing down on your forearm until you feel a gentle stretch on the inside of your elbow. Hold this position for __________ seconds.  Slowly return to the starting position. Repeat __________ times. Complete this exercise __________ times per day.  RANGE OF MOTION - Flexion  Hold your right / left arm at your side and bend your elbow as far as you can using your arm muscles.  Bend the right / left elbow farther by gently pushing up on your forearm until you feel a gentle stretch on the outside of your elbow. Hold this position for __________ seconds.  Slowly return to the starting position. Repeat __________ times. Complete this exercise __________ times per day.  RANGE OF MOTION - Supination, Active  Stand or sit with your elbows at your side. Bend your right / left elbow to 90 degrees.  Turn your palm upward until you feel a gentle stretch on the inside of your forearm.  Hold this position for __________ seconds. Slowly release and return to the starting position. Repeat __________ times. Complete this stretch __________ times per day.  RANGE OF MOTION - Pronation, Active  Stand or sit with your elbows at your side. Bend your right / left elbow to 90 degrees.  Turn your palm downward until you feel a gentle stretch on the top of your forearm.  Hold this position for __________ seconds. Slowly release and return to the starting position. Repeat __________ times. Complete this stretch __________ times per day.  STRETCH - Elbow Flexors  Lie on a firm bed or countertop, on your back. Be  sure that you are in a comfortable position which will allow you to relax your arm muscles.  Place a folded towel under your right / left upper arm, so that your elbow and shoulder are at the same height. Extend your arm; your elbow should not rest on the bed or towel  Allow the weight of your hand to straighten your elbow. Keep your arm and chest muscles relaxed. Your caregiver may ask you to increase the intensity of your stretch by adding a small wrist or hand weight.  Hold for __________ seconds. You should feel a stretch on the inside of your elbow. Slowly return to the starting position. Repeat __________ times. Complete this exercise __________ times per day. STRENGTHENING EXERCISES  These exercises may help you when beginning to rehabilitate your injury. They may resolve your symptoms with or without further involvement from your physician, physical therapist or athletic  trainer. While completing these exercises, remember:   Muscles can gain both the endurance and the strength needed for everyday activities through controlled exercises.  Complete these exercises as instructed by your physician, physical therapist or athletic trainer. Increase the resistance and repetitions only as guided.  You may experience muscle soreness or fatigue, but the pain or discomfort you are trying to eliminate should never worsen during these exercises. If this pain does get worse, stop and make sure you are following the directions exactly. If the pain is still present after adjustments, discontinue the exercise until you can discuss the trouble with your caregiver. STRENGTH - Elbow Flexors, Isometric   Stand or sit upright on a firm surface. Place your right / left arm so that your hand is palm-up and at the height of your waist.  Place your opposite hand on top of your forearm. Gently push down as your right / left arm resists. Push as hard as you can with both arms without causing any pain or movement at  your right / left elbow. Hold this stationary position for __________ seconds.  Gradually release the tension in both arms. Allow your muscles to relax completely before repeating. Repeat __________ times. Complete this exercise __________ times per day. STRENGTH - Elbow Extensors, Isometric  Stand or sit upright on a firm surface. Place your right / left arm so that your palm faces your abdomen and it is at the height of your waist.  Place your opposite hand on the underside of your forearm. Gently push up as your right / left arm resists. Push as hard as you can with both arms without causing any pain or movement at your right / left elbow. Hold this stationary position for __________ seconds.  Gradually release the tension in both arms. Allow your muscles to relax completely before repeating. Repeat __________ times. Complete this exercise __________ times per day. STRENGTH - Elbow Flexors, Supinated  With good posture, stand, or sit on a firm chair without armrests. Allow your right / left arm to rest at your side with your palm facing forward.  Holding a __________ weight, or gripping a rubber exercise band or tubing, bring your hand toward your shoulder.  Allow your muscles to control the resistance, as your hand returns to your side. Repeat __________ times. Complete this exercise __________ times per day.  STRENGTH - Elbow Extensors, Dynamic  With good posture, stand, or sit on a firm chair without armrests. Keeping your upper arms at your side, bring both hands up toward your right / left shoulder while gripping a rubber exercise band or tubing. Your right / left hand should be just below the other hand.  Straighten your right / left elbow. Hold for __________ seconds.  Allow your muscles to control the rubber exercise band, as your hand returns to your shoulder. Repeat __________ times. Complete this exercise __________ times per day. STRENGTH - Forearm Supinators   Sit with  your right / left forearm supported on a table, keeping your elbow below shoulder height. Rest your hand over the edge, palm down.  Gently grip a hammer or a soup ladle.  Without moving your elbow, slowly turn your palm and hand upward to a "thumbs-up" position.  Hold this position for __________ seconds. Slowly return to the starting position. Repeat __________ times. Complete this exercise __________ times per day.  STRENGTH - Forearm Pronators  Sit with your right / left forearm supported on a table, keeping your elbow below shoulder  height. Rest your hand over the edge, palm up.  Gently grip a hammer or a soup ladle.  Without moving your elbow, slowly turn your palm and hand upward to a "thumbs-up" position.  Hold this position for __________ seconds. Slowly return to the starting position. Repeat __________ times. Complete this exercise __________ times per day. Document Released: 09/02/2005 Document Revised: 01/11/2012 Document Reviewed: 01/31/2009 [facility] Patient Information 2015 [place], [place]. This information is not intended to replace advice given to you by your health care provider. Make sure you discuss any questions you have with your health care provider.  Low-Purine Diet Purines are compounds that affect the level of uric acid in your body. A low-purine diet is a diet that is low in purines. Eating a low-purine diet can prevent the level of uric acid in your body from getting too high and causing gout or kidney stones or both. WHAT DO I NEED TO KNOW ABOUT THIS DIET?  Choose low-purine foods. Examples of low-purine foods are listed in the next section.  Drink plenty of fluids, especially water. Fluids can help remove uric acid from your body. Try to drink 8-16 cups (1.9-3.8 L) a day.  Limit foods high in fat, especially saturated fat, as fat makes it harder for the body to get rid of uric acid. Foods high in saturated fat include pizza, cheese, ice cream, whole milk,  fried foods, and gravies. Choose foods that are lower in fat and lean sources of protein. Use olive oil when cooking as it contains healthy fats that are not high in saturated fat.  Limit alcohol. Alcohol interferes with the elimination of uric acid from your body. If you are having a gout attack, avoid all alcohol.  Keep in mind that different people's bodies react differently to different foods. You will probably learn over time which foods do or do not affect you. If you discover that a food tends to cause your gout to flare up, avoid eating that food. You can more freely enjoy foods that do not cause problems. If you have any questions about a food item, talk to your dietitian or health care provider. WHICH FOODS ARE LOW, MODERATE, AND HIGH IN PURINES? The following is a list of foods that are low, moderate, and high in purines. You can eat any amount of the foods that are low in purines. You may be able to have small amounts of foods that are moderate in purines. Ask your health care provider how much of a food moderate in purines you can have. Avoid foods high in purines. Grains  Foods low in purines: Enriched white bread, pasta, rice, cake, cornbread, popcorn.  Foods moderate in purines: Whole-grain breads and cereals, wheat germ, bran, oatmeal. Uncooked oatmeal. Dry wheat bran or wheat germ.  Foods high in purines: Pancakes, [place] toast, biscuits, muffins. Vegetables  Foods low in purines: All vegetables, except those that are moderate in purines.  Foods moderate in purines: Asparagus, cauliflower, spinach, mushrooms, green peas. Fruits  All fruits are low in purines. Meats and other Protein Foods  Foods low in purines: Eggs, nuts, peanut butter.  Foods moderate in purines: 80-90% lean beef, lamb, veal, pork, poultry, fish, eggs, peanut butter, nuts. Crab, lobster, oysters, and shrimp. Cooked dried beans, peas, and lentils.  Foods high in purines: Anchovies, sardines, herring,  mussels, tuna, codfish, scallops, trout, and haddock. Berniece Salines. Organ meats (such as liver or kidney). Tripe. Game meat. Goose. Sweetbreads. Dairy  All dairy foods are low in purines.  Low-fat and fat-free dairy products are best because they are low in saturated fat. Beverages  Drinks low in purines: Water, carbonated beverages, tea, coffee, cocoa.  Drinks moderate in purines: Soft drinks and other drinks sweetened with high-fructose corn syrup. Juices. To find whether a food or drink is sweetened with high-fructose corn syrup, look at the ingredients list.  Drinks high in purines: Alcoholic beverages (such as beer). Condiments  Foods low in purines: Salt, herbs, olives, pickles, relishes, vinegar.  Foods moderate in purines: Butter, margarine, oils, mayonnaise. Fats and Oils  Foods low in purines: All types, except gravies and sauces made with meat.  Foods high in purines: Gravies and sauces made with meat. Other Foods  Foods low in purines: Sugars, sweets, gelatin. Cake. Soups made without meat.  Foods moderate in purines: Meat-based or fish-based soups, broths, or bouillons. Foods and drinks sweetened with high-fructose corn syrup.  Foods high in purines: High-fat desserts (such as ice cream, cookies, cakes, pies, doughnuts, and chocolate). Contact your dietitian for more information on foods that are not listed here. Document Released: 02/13/2011 Document Revised: 10/24/2013 Document Reviewed: 09/25/2013 [facility] Patient Information 2015 [place], [place]. This information is not intended to replace advice given to you by your health care provider. Make sure you discuss any questions you have with your health care provider.

## 2014-10-15 NOTE — ED Provider Notes (Signed)
CSN: [id number]     Arrival date & time 10/15/14  1001 History   First MD Initiated Contact with Patient 10/15/14 1033     Chief Complaint  Patient presents with  . Elbow Pain    left   (Consider location/radiation/quality/duration/timing/severity/associated sxs/prior Treatment) HPI         58 year old male presents complaining of left elbow pain and decreased range of motion. This began acutely 6 days ago. He denies any injury, he just woke up with these symptoms. He has never had them before. There is swelling of the left elbow but no redness. No fever. No significant history of arthritis nor any history of gout. He also reports that he has been donating plasma twice weekly, including last week.    Past Medical History  Diagnosis Date  . Hypertension   . Diabetes mellitus without complication   . Acute MI     reports prior MI in [place]  . Bronchitis   . HLD (hyperlipidemia)   . Diabetes   . Tobacco abuse    Past Surgical History  Procedure Laterality Date  . Appendectomy    . Cyst removed off back     History reviewed. No pertinent family history. History  Substance Use Topics  . Smoking status: Current Every Day Smoker    Types: Cigarettes  . Smokeless tobacco: Not on file  . Alcohol Use: Yes     Comment: occasionall beer    Review of Systems  Musculoskeletal: Positive for joint swelling and arthralgias.  Neurological: Negative for numbness.  All other systems reviewed and are negative.   Allergies  Review of patient's allergies indicates no known allergies.  Home Medications   Prior to Admission medications   Medication Sig Start Date End Date Taking? Authorizing Provider  atenolol-chlorthalidone (TENORETIC) 100-25 MG per tablet Take 1 tablet by mouth daily.   Yes Historical Provider, MD  nabumetone (RELAFEN) 500 MG tablet Take 500 mg by mouth daily.   Yes Historical Provider, MD  albuterol (PROVENTIL HFA;VENTOLIN HFA) 108 (90 BASE) MCG/ACT inhaler Inhale 2 puffs  into the lungs every 6 (six) hours as needed for wheezing.    Historical Provider, MD  amLODipine (NORVASC) 10 MG tablet Take 10 mg by mouth daily.    Historical Provider, MD  aspirin 325 MG EC tablet Take 325 mg by mouth daily.    Historical Provider, MD  colchicine (COLCRYS) 0.6 MG tablet Take 1 tablet (0.6 mg total) by mouth 2 (two) times daily. 10/15/14   Freeman Caldron Ibrohim Simmers, PA-C  guaiFENesin-dextromethorphan (ROBITUSSIN DM) 100-10 MG/5ML syrup Take 15 mLs by mouth every 4 (four) hours as needed for cough.    Historical Provider, MD  HYDROcodone-acetaminophen (NORCO) 5-325 MG per tablet Take 1 tablet by mouth every 6 (six) hours as needed for moderate pain. 12/15/13   Tatyana A Kirichenko, PA-C  propranolol (INDERAL) 10 MG tablet Take 1 tablet (10 mg total) by mouth as needed. 02/05/14   Josue Hector, MD  tetrahydrozoline 0.05 % ophthalmic solution Place 3 drops into both eyes daily as needed (for dry eyes).    Historical Provider, MD  trimethoprim-polymyxin b (POLYTRIM) ophthalmic solution Place 1 drop into the left eye every 4 (four) hours. 09/24/13   Montine Circle, PA-C   BP 127/85 mmHg  Pulse 60  Temp(Src) 97.8 F (36.6 C) (Oral)  Resp 14  SpO2 98% Physical Exam  Constitutional: He is oriented to person, place, and time. He appears well-developed and well-nourished. No distress.  HENT:  Head: Normocephalic.  Cardiovascular:  Pulses:      Radial pulses are 2+ on the left side.  Pulmonary/Chest: Effort normal. No respiratory distress.  Musculoskeletal:       Left elbow: He exhibits decreased range of motion (decreased flexion and extension ), swelling and effusion. He exhibits no deformity. Tenderness (diffuse) found.  Pain increased with supination  Neurological: He is alert and oriented to person, place, and time. Coordination normal.  Skin: Skin is warm and dry. No rash noted. He is not diaphoretic.  Psychiatric: He has a normal mood and affect. Judgment normal.  Nursing note and  vitals reviewed.   ED Course  Procedures (including critical care time) Labs Review Labs Reviewed  CBC WITH DIFFERENTIAL  URIC ACID    Imaging Review Dg Elbow Complete Left  10/15/2014   CLINICAL DATA:  Pain for several weeks.  No known injury.  EXAM: LEFT ELBOW - COMPLETE 3+ VIEW  COMPARISON:  None.  FINDINGS: Small knee joint effusion cannot be excluded. Tiny bony densities for noted about the elbow. These could represent loose bodies. Diffuse degenerative change noted. Old radial epicondyle distal humeral fracture versus secondary ossification center noted. No evidence of acute fracture  IMPRESSION: 1. Cannot exclude small elbow joint effusion. 2. Degenerative changes left elbow. Calcific densities noted about the left elbow may represent loose bodies. No acute bony abnormality identified.   Electronically Signed   By: Marcello Moores  Register   On: 10/15/2014 11:34     MDM   1. Elbow pain, left   2. Elbow effusion, left   3. Acute monoarthritis   4. Acute idiopathic gout, unspecified site    Acute monoarticular arthritis. Uric acid is high normal, no leukocytosis. The possibility of septic arthritis was entertained given that this patient has been donating plasma in the [facility] but his course is resolving without antibiotics which you would not expect. He is afebrile and nontoxic. He is taking a daily NSAID, will add twice a day colchicine for probable gout and he will follow-up with orthopedics.   Discharge Medication List as of 10/15/2014 12:01 PM    START taking these medications   Details  colchicine (COLCRYS) 0.6 MG tablet Take 1 tablet (0.6 mg total) by mouth 2 (two) times daily., Starting 10/15/2014, Until Discontinued, Normal           Liam Graham, PA-C 10/15/14 [place] Shanta Dorvil, PA-C 10/15/14 1222

## 2014-10-16 NOTE — ED Notes (Addendum)
Uric acid 7.4.  Treated with Colchicine.  Message to Archie Balboa PA asking if pt. needs notified. Roselyn Meier 10/16/2014 Thedore Mins wrote that he discussed it with the pt. while he was here.  No further action needed. 10/22/2014

## 2015-06-26 ENCOUNTER — Ambulatory Visit (INDEPENDENT_AMBULATORY_CARE_PROVIDER_SITE_OTHER): Payer: Medicare Other | Admitting: Family Medicine

## 2015-06-26 VITALS — BP 136/90 | HR 67 | Temp 98.9°F | Resp 18 | Ht 68.5 in | Wt 227.0 lb

## 2015-06-26 DIAGNOSIS — Z8639 Personal history of other endocrine, nutritional and metabolic disease: Secondary | ICD-10-CM

## 2015-06-26 DIAGNOSIS — Z8739 Personal history of other diseases of the musculoskeletal system and connective tissue: Secondary | ICD-10-CM

## 2015-06-26 DIAGNOSIS — I10 Essential (primary) hypertension: Secondary | ICD-10-CM

## 2015-06-26 LAB — BASIC METABOLIC PANEL
BUN: 17 mg/dL (ref 7–25)
CHLORIDE: 107 mmol/L (ref 98–110)
CO2: 26 mmol/L (ref 20–31)
CREATININE: 1.18 mg/dL (ref 0.70–1.33)
Calcium: 10 mg/dL (ref 8.6–10.3)
GLUCOSE: 98 mg/dL (ref 65–99)
Potassium: 4.8 mmol/L (ref 3.5–5.3)
Sodium: 141 mmol/L (ref 135–146)
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
## 2015-06-26 NOTE — Patient Instructions (Addendum)
Since you are not having any back pain and not had in the past year, I do not see a reason why you would not be able to return to work. 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 I did complete a letter for your school stating information from today. Check your blood pressures outside of the office, and if they are above 140/90, recommend follow-up here or other provider to restart blood pressure medicines. With a history of a heart attack, I would recommend a baby aspirin (81 mg) once per day to lessen chance of heart issues.  I did check your kidney function and other localizing including blood sugar on the labs today.You should receive a call or letter about your lab results within the next week to 10 days.  If these are abnormal, you will need to follow-up to discuss this further. Follow-up here or another primary care provider in the next few months for establishing primary care. Let me know if other information needed for your school.

## 2015-06-30 ENCOUNTER — Encounter: Payer: Self-pay | Admitting: Family Medicine

## 2015-07-11 ENCOUNTER — Telehealth: Payer: Self-pay

## 2015-07-12 ENCOUNTER — Telehealth: Payer: Self-pay

## 2015-07-12 NOTE — Telephone Encounter (Signed)
I'm confused. I said we could put that information in the letter yesterday, and then I signed off the letter that I thought had this wording. I'm not sure why we would need him to come in if we have put the information on the letter as requested.

## 2015-07-12 NOTE — Telephone Encounter (Signed)
Dr. Carlota Raspberry do you just want me to tell the patient to come in?  I feel we have done all we can.

## 2015-07-12 NOTE — Telephone Encounter (Signed)
Corene Cornea from [facility] called with questions regarding the letter that Dr. Carlota Raspberry wrote for Mr. Vanderschaaf for his visit on [phone]; The letter says dr. Carlota Raspberry does not see any reason why Mr. Dancel can't pursue employment. However, Corene Cornea needs Dr. Carlota Raspberry to be more specific and confirm that Mr. Laday can pursue "substantial gainful employment". This is the term that is required in order for Corene Cornea to proceed with the Mr. Hayashi financial aid. Corene Cornea is requesting that Dr. Carlota Raspberry call him and simply confirm the request by stating "Mr. Doswell can pursue substantial gainful employment".

## 2015-07-15 NOTE — Telephone Encounter (Signed)
Called and spoke to pt tonight. Pt thought the letter was okay. He will be at the school tomorrow so he's going to make sure everything is okay. If it is not okay he is going to call.

## 2015-08-26 ENCOUNTER — Encounter: Payer: Medicare Other | Admitting: Family Medicine

## 2015-09-03 ENCOUNTER — Encounter: Payer: Self-pay | Admitting: Family Medicine

## 2015-09-04 ENCOUNTER — Telehealth: Payer: Self-pay | Admitting: Family Medicine

## 2015-09-04 ENCOUNTER — Encounter: Payer: Medicare Other | Admitting: Family Medicine

## 2015-09-04 NOTE — Telephone Encounter (Signed)
CALLED PATIENT FOR DR Tamala Julian TO FIND OUT WHY HE DID NOT SHOW UP FOR HIS APPOINTMENT.  DO WE NEED TO RESCHEDULE HIM FOR ANOTHER DAY.

## 2015-10-26 ENCOUNTER — Ambulatory Visit (INDEPENDENT_AMBULATORY_CARE_PROVIDER_SITE_OTHER): Payer: Medicare Other | Admitting: Internal Medicine

## 2015-10-26 VITALS — BP 188/100 | HR 66 | Temp 98.1°F | Resp 16 | Ht 68.0 in | Wt 235.0 lb

## 2015-10-26 DIAGNOSIS — M5030 Other cervical disc degeneration, unspecified cervical region: Secondary | ICD-10-CM

## 2015-10-26 DIAGNOSIS — J4520 Mild intermittent asthma, uncomplicated: Secondary | ICD-10-CM | POA: Diagnosis not present

## 2015-10-26 DIAGNOSIS — Z8639 Personal history of other endocrine, nutritional and metabolic disease: Secondary | ICD-10-CM | POA: Diagnosis not present

## 2015-10-26 DIAGNOSIS — M5137 Other intervertebral disc degeneration, lumbosacral region: Secondary | ICD-10-CM

## 2015-10-26 DIAGNOSIS — I10 Essential (primary) hypertension: Secondary | ICD-10-CM | POA: Diagnosis not present

## 2015-10-26 MED ORDER — CYCLOBENZAPRINE HCL 10 MG PO TABS: 10.0000 mg | ORAL_TABLET | Freq: Every day | ORAL | Status: DC

## 2015-10-26 MED ORDER — HYDROCHLOROTHIAZIDE 12.5 MG PO CAPS: 12.5000 mg | ORAL_CAPSULE | Freq: Every day | ORAL | Status: DC

## 2015-10-26 MED ORDER — TRAMADOL HCL 50 MG PO TABS: 50.0000 mg | ORAL_TABLET | Freq: Four times a day (QID) | ORAL | Status: DC | PRN

## 2015-10-26 MED ORDER — MELOXICAM 15 MG PO TABS: 15.0000 mg | ORAL_TABLET | Freq: Every day | ORAL | Status: DC

## 2015-10-26 MED ORDER — AMLODIPINE BESYLATE 10 MG PO TABS: 10.0000 mg | ORAL_TABLET | Freq: Every day | ORAL | Status: DC
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
## 2015-11-07 ENCOUNTER — Encounter: Payer: Self-pay | Admitting: Urgent Care

## 2015-11-07 ENCOUNTER — Ambulatory Visit (INDEPENDENT_AMBULATORY_CARE_PROVIDER_SITE_OTHER): Payer: Medicare Other | Admitting: Urgent Care

## 2015-11-07 VITALS — BP 130/82 | HR 88 | Temp 98.0°F | Resp 16 | Ht 68.0 in | Wt 226.2 lb

## 2015-11-07 DIAGNOSIS — M549 Dorsalgia, unspecified: Secondary | ICD-10-CM

## 2015-11-07 DIAGNOSIS — Z0000 Encounter for general adult medical examination without abnormal findings: Secondary | ICD-10-CM

## 2015-11-07 DIAGNOSIS — J4540 Moderate persistent asthma, uncomplicated: Secondary | ICD-10-CM

## 2015-11-07 DIAGNOSIS — I10 Essential (primary) hypertension: Secondary | ICD-10-CM | POA: Diagnosis not present

## 2015-11-07 DIAGNOSIS — Z23 Encounter for immunization: Secondary | ICD-10-CM

## 2015-11-07 DIAGNOSIS — Z87828 Personal history of other (healed) physical injury and trauma: Secondary | ICD-10-CM

## 2015-11-07 DIAGNOSIS — G8929 Other chronic pain: Secondary | ICD-10-CM | POA: Diagnosis not present

## 2015-11-07 DIAGNOSIS — F17200 Nicotine dependence, unspecified, uncomplicated: Secondary | ICD-10-CM | POA: Diagnosis not present

## 2015-11-07 DIAGNOSIS — R002 Palpitations: Secondary | ICD-10-CM

## 2015-11-07 DIAGNOSIS — Z1211 Encounter for screening for malignant neoplasm of colon: Secondary | ICD-10-CM

## 2015-11-07 DIAGNOSIS — I519 Heart disease, unspecified: Secondary | ICD-10-CM

## 2015-11-07 LAB — CBC
HCT: 42.7 % (ref 39.0–52.0)
HEMOGLOBIN: 14.8 g/dL (ref 13.0–17.0)
MCH: 29 pg (ref 26.0–34.0)
MCHC: 34.7 g/dL (ref 30.0–36.0)
MCV: 83.6 fL (ref 78.0–100.0)
MPV: 10.1 fL (ref 8.6–12.4)
Platelets: 416 10*3/uL — ABNORMAL HIGH (ref 150–400)
RBC: 5.11 MIL/uL (ref 4.22–5.81)
RDW: 14.7 % (ref 11.5–15.5)
WBC: 10.2 10*3/uL (ref 4.0–10.5)

## 2015-11-07 LAB — COMPREHENSIVE METABOLIC PANEL
ALBUMIN: 4.4 g/dL (ref 3.6–5.1)
ALK PHOS: 44 U/L (ref 40–115)
ALT: 40 U/L (ref 9–46)
AST: 24 U/L (ref 10–35)
BUN: 16 mg/dL (ref 7–25)
CO2: 21 mmol/L (ref 20–31)
Calcium: 9.8 mg/dL (ref 8.6–10.3)
Chloride: 108 mmol/L (ref 98–110)
Creat: 1.02 mg/dL (ref 0.70–1.33)
GLUCOSE: 88 mg/dL (ref 65–99)
Potassium: 4.4 mmol/L (ref 3.5–5.3)
Sodium: 137 mmol/L (ref 135–146)
Total Bilirubin: 0.4 mg/dL (ref 0.2–1.2)
Total Protein: 7 g/dL (ref 6.1–8.1)

## 2015-11-07 LAB — TSH: TSH: 0.771 u[IU]/mL (ref 0.350–4.500)

## 2015-11-07 MED ORDER — ALBUTEROL SULFATE HFA 108 (90 BASE) MCG/ACT IN AERS: 2.0000 | INHALATION_SPRAY | Freq: Four times a day (QID) | RESPIRATORY_TRACT | Status: DC | PRN

## 2015-11-07 MED ORDER — PREDNISONE 20 MG PO TABS: ORAL_TABLET | ORAL | Status: DC

## 2015-11-07 MED ORDER — HYDROCHLOROTHIAZIDE 12.5 MG PO CAPS: 12.5000 mg | ORAL_CAPSULE | Freq: Every day | ORAL | Status: DC
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
## 2015-11-07 NOTE — Patient Instructions (Signed)
Keeping you healthy  Get these tests  Blood pressure- Have your blood pressure checked once a year by your healthcare provider.  Normal blood pressure is 120/80  Weight- Have your body mass index (BMI) calculated to screen for obesity.  BMI is a measure of body fat based on height and weight. You can also calculate your own BMI at [email or web address].  Cholesterol- Have your cholesterol checked every year.  Diabetes- Have your blood sugar checked regularly if you have high blood pressure, high cholesterol, have a family history of diabetes or if you are overweight.  Screening for Colon Cancer- Colonoscopy starting at age 106.  Screening may begin sooner depending on your family history and other health conditions. Follow up colonoscopy as directed by your Gastroenterologist.  Screening for Prostate Cancer- Both blood work (PSA) and a rectal exam help screen for Prostate Cancer.  Screening begins at age 45 with African-American men and at age 55 with Caucasian men.  Screening may begin sooner depending on your family history.  Take these medicines  Aspirin- One aspirin daily can help prevent Heart disease and Stroke.  Flu shot- Every fall.  Tetanus- Every 10 years.  Zostavax- Once after the age of 64 to prevent Shingles.  Pneumonia shot- Once after the age of 61; if you are younger than 50, ask your healthcare provider if you need a Pneumonia shot.  Take these steps  Don't smoke- If you do smoke, talk to your doctor about quitting.  For tips on how to quit, go to www.smokefree.gov or call 1-800-QUIT-NOW.  Be physically active- Exercise 5 days a week for at least 30 minutes.  If you are not already physically active start slow and gradually work up to 30 minutes of moderate physical activity.  Examples of moderate activity include walking briskly, mowing the yard, dancing, swimming, bicycling, etc.  Eat a healthy diet- Eat a variety of healthy food such as fruits, vegetables, low  fat milk, low fat cheese, yogurt, lean meant, poultry, fish, beans, tofu, etc. For more information go to www.thenutritionsource.org  Drink alcohol in moderation- Limit alcohol intake to less than two drinks a day. Never drink and drive.  Dentist- Brush and floss twice daily; visit your dentist twice a year.  Depression- Your emotional health is as important as your physical health. If you're feeling down, or losing interest in things you would normally enjoy please talk to your healthcare provider.  Eye exam- Visit your eye doctor every year.  Safe sex- If you may be exposed to a sexually transmitted infection, use a condom.  Seat belts- Seat belts can save your life; always wear one.  Smoke/Carbon Monoxide detectors- These detectors need to be installed on the appropriate level of your home.  Replace batteries at least once a year.  Skin cancer- When out in the sun, cover up and use sunscreen 15 SPF or higher.  Violence- If anyone is threatening you, please tell your healthcare provider.  Living Will/ Health care power of attorney- Speak with your healthcare provider and family.   Asthma, Adult Asthma is a recurring condition in which the airways tighten and narrow. Asthma can make it difficult to breathe. It can cause coughing, wheezing, and shortness of breath. Asthma episodes, also called asthma attacks, range from minor to life-threatening. Asthma cannot be cured, but medicines and lifestyle changes can help control it. CAUSES Asthma is believed to be caused by inherited (genetic) and environmental factors, but its exact cause is unknown. Asthma may be triggered  by allergens, lung infections, or irritants in the air. Asthma triggers are different for each person. Common triggers include:   Animal dander.  Dust mites.  Cockroaches.  Pollen from trees or grass.  Mold.  Smoke.  Air pollutants such as dust, household cleaners, hair sprays, aerosol sprays, paint fumes, strong  chemicals, or strong odors.  Cold air, weather changes, and winds (which increase molds and pollens in the air).  Strong emotional expressions such as crying or laughing hard.  Stress.  Certain medicines (such as aspirin) or types of drugs (such as beta-blockers).  Sulfites in foods and drinks. Foods and drinks that may contain sulfites include dried fruit, potato chips, and sparkling grape juice.  Infections or inflammatory conditions such as the flu, a cold, or an inflammation of the nasal membranes (rhinitis).  Gastroesophageal reflux disease (GERD).  Exercise or strenuous activity. SYMPTOMS Symptoms may occur immediately after asthma is triggered or many hours later. Symptoms include:  Wheezing.  Excessive nighttime or early morning coughing.  Frequent or severe coughing with a common cold.  Chest tightness.  Shortness of breath. DIAGNOSIS  The diagnosis of asthma is made by a review of your medical history and a physical exam. Tests may also be performed. These may include:  Lung function studies. These tests show how much air you breathe in and out.  Allergy tests.  Imaging tests such as X-rays. TREATMENT  Asthma cannot be cured, but it can usually be controlled. Treatment involves identifying and avoiding your asthma triggers. It also involves medicines. There are 2 classes of medicine used for asthma treatment:   Controller medicines. These prevent asthma symptoms from occurring. They are usually taken every day.  Reliever or rescue medicines. These quickly relieve asthma symptoms. They are used as needed and provide short-term relief. Your health care provider will help you create an asthma action plan. An asthma action plan is a written plan for managing and treating your asthma attacks. It includes a list of your asthma triggers and how they may be avoided. It also includes information on when medicines should be taken and when their dosage should be changed. An  action plan may also involve the use of a device called a peak flow meter. A peak flow meter measures how well the lungs are working. It helps you monitor your condition. HOME CARE INSTRUCTIONS   Take medicines only as directed by your health care provider. Speak with your health care provider if you have questions about how or when to take the medicines.  Use a peak flow meter as directed by your health care provider. Record and keep track of readings.  Understand and use the action plan to help minimize or stop an asthma attack without needing to seek medical care.  Control your home environment in the following ways to help prevent asthma attacks:  Do not smoke. Avoid being exposed to secondhand smoke.  Change your heating and air conditioning filter regularly.  Limit your use of fireplaces and wood stoves.  Get rid of pests (such as roaches and mice) and their droppings.  Throw away plants if you see mold on them.  Clean your floors and dust regularly. Use unscented cleaning products.  Try to have someone else vacuum for you regularly. Stay out of rooms while they are being vacuumed and for a short while afterward. If you vacuum, use a dust mask from a hardware store, a double-layered or microfilter vacuum cleaner bag, or a vacuum cleaner with a HEPA  filter.  Replace carpet with wood, tile, or vinyl flooring. Carpet can trap dander and dust.  Use allergy-proof pillows, mattress covers, and box spring covers.  Wash bed sheets and blankets every week in hot water and dry them in a dryer.  Use blankets that are made of polyester or cotton.  Clean bathrooms and kitchens with bleach. If possible, have someone repaint the walls in these rooms with mold-resistant paint. Keep out of the rooms that are being cleaned and painted.  Wash hands frequently. SEEK MEDICAL CARE IF:   You have wheezing, shortness of breath, or a cough even if taking medicine to prevent attacks.  The colored  mucus you cough up (sputum) is thicker than usual.  Your sputum changes from clear or white to yellow, green, gray, or bloody.  You have any problems that may be related to the medicines you are taking (such as a rash, itching, swelling, or trouble breathing).  You are using a reliever medicine more than 2-3 times per week.  Your peak flow is still at 50-79% of your personal best after following your action plan for 1 hour.  You have a fever. SEEK IMMEDIATE MEDICAL CARE IF:   You seem to be getting worse and are unresponsive to treatment during an asthma attack.  You are short of breath even at rest.  You get short of breath when doing very little physical activity.  You have difficulty eating, drinking, or talking due to asthma symptoms.  You develop chest pain.  You develop a fast heartbeat.  You have a bluish color to your lips or fingernails.  You are light-headed, dizzy, or faint.  Your peak flow is less than 50% of your personal best.   This information is not intended to replace advice given to you by your health care provider. Make sure you discuss any questions you have with your health care provider.   Document Released: 10/19/2005 Document Revised: 07/10/2015 Document Reviewed: 05/18/2013 Elsevier Interactive Patient Education 2016 [employer].    Smoking Cessation, Tips for Success If you are ready to quit smoking, congratulations! You have chosen to help yourself be healthier. Cigarettes bring nicotine, tar, carbon monoxide, and other irritants into your body. Your lungs, heart, and blood vessels will be able to work better without these poisons. There are many different ways to quit smoking. Nicotine gum, nicotine patches, a nicotine inhaler, or nicotine nasal spray can help with physical craving. Hypnosis, support groups, and medicines help break the habit of smoking. WHAT THINGS CAN I DO TO MAKE QUITTING EASIER?  Here are some tips to help you quit for  good:  Pick a date when you will quit smoking completely. Tell all of your friends and family about your plan to quit on that date.  Do not try to slowly cut down on the number of cigarettes you are smoking. Pick a quit date and quit smoking completely starting on that day.  Throw away all cigarettes.   Clean and remove all ashtrays from your home, work, and car.  On a card, write down your reasons for quitting. Carry the card with you and read it when you get the urge to smoke.  Cleanse your body of nicotine. Drink enough water and fluids to keep your urine clear or pale yellow. Do this after quitting to flush the nicotine from your body.  Learn to predict your moods. Do not let a bad situation be your excuse to have a cigarette. Some situations in your  life might tempt you into wanting a cigarette.  Never have "just one" cigarette. It leads to wanting another and another. Remind yourself of your decision to quit.  Change habits associated with smoking. If you smoked while driving or when feeling stressed, try other activities to replace smoking. Stand up when drinking your coffee. Brush your teeth after eating. Sit in a different chair when you read the paper. Avoid alcohol while trying to quit, and try to drink fewer caffeinated beverages. Alcohol and caffeine may urge you to smoke.  Avoid foods and drinks that can trigger a desire to smoke, such as sugary or spicy foods and alcohol.  Ask people who smoke not to smoke around you.  Have something planned to do right after eating or having a cup of coffee. For example, plan to take a walk or exercise.  Try a relaxation exercise to calm you down and decrease your stress. Remember, you may be tense and nervous for the first 2 weeks after you quit, but this will pass.  Find new activities to keep your hands busy. Play with a pen, coin, or rubber band. Doodle or draw things on paper.  Brush your teeth right after eating. This will help  cut down on the craving for the taste of tobacco after meals. You can also try mouthwash.   Use oral substitutes in place of cigarettes. Try using lemon drops, carrots, cinnamon sticks, or chewing gum. Keep them handy so they are available when you have the urge to smoke.  When you have the urge to smoke, try deep breathing.  Designate your home as a nonsmoking area.  If you are a heavy smoker, ask your health care provider about a prescription for nicotine chewing gum. It can ease your withdrawal from nicotine.  Reward yourself. Set aside the cigarette money you save and buy yourself something nice.  Look for support from others. Join a support group or smoking cessation program. Ask someone at home or at work to help you with your plan to quit smoking.  Always ask yourself, "Do I need this cigarette or is this just a reflex?" Tell yourself, "Today, I choose not to smoke," or "I do not want to smoke." You are reminding yourself of your decision to quit.  Do not replace cigarette smoking with electronic cigarettes (commonly called e-cigarettes). The safety of e-cigarettes is unknown, and some may contain harmful chemicals.  If you relapse, do not give up! Plan ahead and think about what you will do the next time you get the urge to smoke. HOW WILL I FEEL WHEN I QUIT SMOKING? You may have symptoms of withdrawal because your body is used to nicotine (the addictive substance in cigarettes). You may crave cigarettes, be irritable, feel very hungry, cough often, get headaches, or have difficulty concentrating. The withdrawal symptoms are only temporary. They are strongest when you first quit but will go away within 10-14 days. When withdrawal symptoms occur, stay in control. Think about your reasons for quitting. Remind yourself that these are signs that your body is healing and getting used to being without cigarettes. Remember that withdrawal symptoms are easier to treat than the major diseases  that smoking can cause.  Even after the withdrawal is over, expect periodic urges to smoke. However, these cravings are generally short lived and will go away whether you smoke or not. Do not smoke! WHAT RESOURCES ARE AVAILABLE TO HELP ME QUIT SMOKING? Your health care provider can direct you to community  resources or hospitals for support, which may include:  Group support.  Education.  Hypnosis.  Therapy.   This information is not intended to replace advice given to you by your health care provider. Make sure you discuss any questions you have with your health care provider.   Document Released: 07/17/2004 Document Revised: 11/09/2014 Document Reviewed: 04/06/2013 Elsevier Interactive Patient Education [employer].

## 2015-11-13 ENCOUNTER — Encounter: Payer: Self-pay | Admitting: Urgent Care

## 2015-11-18 ENCOUNTER — Other Ambulatory Visit: Payer: Self-pay | Admitting: *Deleted

## 2015-11-18 ENCOUNTER — Encounter: Payer: Self-pay | Admitting: Cardiology

## 2015-11-18 ENCOUNTER — Ambulatory Visit (INDEPENDENT_AMBULATORY_CARE_PROVIDER_SITE_OTHER): Payer: Medicare Other | Admitting: Cardiology

## 2015-11-18 VITALS — BP 142/88 | HR 69 | Ht 68.0 in | Wt 231.1 lb

## 2015-11-18 DIAGNOSIS — R002 Palpitations: Secondary | ICD-10-CM

## 2015-11-18 NOTE — Patient Instructions (Signed)
Medication Instructions:  Your physician recommends that you continue on your current medications as directed. Please refer to the Current Medication list given to you today.   Labwork: None ordered  Testing/Procedures: Your physician has recommended that you wear a holter monitor. Holter monitors are medical devices that record the heart's electrical activity. Doctors most often use these monitors to diagnose arrhythmias. Arrhythmias are problems with the speed or rhythm of the heartbeat. The monitor is a small, portable device. You can wear one while you do your normal daily activities. This is usually used to diagnose what is causing palpitations/syncope (passing out).    Follow-Up: Your physician recommends that you schedule a follow-up appointment in: 1 week with Brittainy Simmons,PA or another APP   Any Other Special Instructions Will Be Listed Below (If Applicable).     If you need a refill on your cardiac medications before your next appointment, please call your pharmacy.

## 2015-11-18 NOTE — Progress Notes (Signed)
11/18/2015 Isaac Contreras   Jun 12, 1956  [id number]  Primary Physician Tabor, PA-C Primary Cardiologist: Dr. Johnsie Cancel   Reason for Visit/CC: PCP Referral for Palpitations  HPI:  The patient is a 60 year old male with history of hypertension, hyperlipidemia, diabetes, tobacco abuse and prior reported history of MI in 52 while living in [place]. He moved to [facility] in 2013.  He was seen by  Dr. Johnsie Cancel in the ED 02/05/2014 for chest pain evaluation. This was felt to be atypical and workup in the ED was negative, however given his reported cardiac history of previous MI,  he was followed up in the outpatient setting. An exercise tolerance test was arranged. This was negative for ischemia. 2-D echocardiogram was also done 03/13/2014 which was normal with an EF of 50-55%. Wall motion was also normal. There were no valve abnormalities.  He was recently seen by his PCP and reported recent history of palpitations. His PCP check a TSH which was normal. CBC was negative for anemia. Hemoglobin was 14.8. Comprehensive metabolic panel was also unremarkable. Potassium was stable at 4.4. He was instructed to follow-up in our clinic for further evaluation.  He reports a 4 week history of palpitations that occur almost daily. Symptoms occur off and on throughout the day, lasting up to 15 minutes at a time. He notes occasional flip-flopping sensation in his chest. He denies any associated chest discomfort. No associated dyspnea, lightheadedness, dizziness, diaphoresis, syncope/near-syncope. He denies any excessive caffeine consumption. No  alcohol intake. He also denies any recent use of his albuterol inhaler.  He is currently asymptomatic in clinic. EKG shows normal sinus rhythm. Heart rate 69 bpm. QT/QTC is 372/398 ms.   Current Outpatient Prescriptions  Medication Sig Dispense Refill  . albuterol (PROVENTIL HFA;VENTOLIN HFA) 108 (90 Base) MCG/ACT inhaler Inhale 2 puffs into the lungs every 6 (six) hours as  needed for wheezing. 8 g 6  . aspirin 81 MG tablet Take 81 mg by mouth daily.    . cyclobenzaprine (FLEXERIL) 10 MG tablet Take 1 tablet (10 mg total) by mouth at bedtime. 30 tablet 0  . hydrochlorothiazide (MICROZIDE) 12.5 MG capsule Take 1 capsule (12.5 mg total) by mouth daily. 90 capsule 3  . meloxicam (MOBIC) 15 MG tablet Take 1 tablet (15 mg total) by mouth daily. 30 tablet 0  . traMADol (ULTRAM) 50 MG tablet Take 50 mg by mouth every 6 (six) hours as needed for severe pain.     No current facility-administered medications for this visit.    No Known Allergies  Social History   Social History  . Marital Status: Single    Spouse Name: N/A  . Number of Children: N/A  . Years of Education: N/A   Occupational History  . Not on file.   Social History Main Topics  . Smoking status: Former Smoker    Types: Cigarettes  . Smokeless tobacco: Not on file  . Alcohol Use: No     Comment: occasionall beer  . Drug Use: No  . Sexual Activity: Not on file   Other Topics Concern  . Not on file   Social History Narrative     Review of Systems: General: negative for chills, fever, night sweats or weight changes.  Cardiovascular: negative for chest pain, dyspnea on exertion, edema, orthopnea, palpitations, paroxysmal nocturnal dyspnea or shortness of breath Dermatological: negative for rash Respiratory: negative for cough or wheezing Urologic: negative for hematuria Abdominal: negative for nausea, vomiting, diarrhea, bright red blood per rectum, melena,  or hematemesis Neurologic: negative for visual changes, syncope, or dizziness All other systems reviewed and are otherwise negative except as noted above.    Blood pressure 142/88, pulse 69, height 5\' 8"  (1.727 m), weight 231 lb 1.9 oz (104.835 kg).  General appearance: alert, cooperative and no distress Neck: no carotid bruit and no JVD Lungs: clear to auscultation bilaterally Heart: regular rate and rhythm, S1, S2 normal, no  murmur, click, rub or gallop Extremities: no LEE Pulses: 2+ and symmetric Skin: warm and dry Neurologic: Grossly normal  EKG normal sinus rhythm. Heart rate 69 bpm. QT/QTC is 372/398 ms.  ASSESSMENT AND PLAN:   1. Palpitations: Recent lab work including TSH, CBC and Comprehensive Metabolic Panel are unremarkable. EKG shows normal sinus rhythm. Heart rate is 69 bpm. Physical exam is unremarkable. He does not appear to be on any medications with potential side effects of increased heart rate. He denies any excessive caffeine or alcohol consumption. He notes that his symptoms occur daily. We will further evaluate with a 48 hour heart monitor to assess for PVCs/ other cardiac arrhythmias.   2. CAD: reported h/o MI in the 83s in [place]. ETT and 2D echo in 2015 were both normal. EKG unremarkable. He denies any anginal symptoms. Continue medical therapy.   3. HTN: mildly elevated in the 0000000 systolic. We may potentially add a BB to regimen based on monitor results.   4. DM: followed by PCP.    PLAN  48 Hr heart monitor. F/u in 1 week.   Jovonne Wilton PA-C 11/18/2015 10:18 AM

## 2015-11-19 ENCOUNTER — Ambulatory Visit (INDEPENDENT_AMBULATORY_CARE_PROVIDER_SITE_OTHER): Payer: Medicare Other

## 2015-11-19 ENCOUNTER — Telehealth: Payer: Self-pay | Admitting: Internal Medicine

## 2015-11-19 DIAGNOSIS — R002 Palpitations: Secondary | ICD-10-CM | POA: Diagnosis not present

## 2015-11-20 ENCOUNTER — Encounter: Payer: Self-pay | Admitting: Internal Medicine

## 2015-11-20 NOTE — Telephone Encounter (Signed)
Dr Gessner reviewed records and has accepted patient. Ok to schedule Direct Colon. Left message for patient to return my call. °

## 2015-11-21 ENCOUNTER — Ambulatory Visit (INDEPENDENT_AMBULATORY_CARE_PROVIDER_SITE_OTHER): Payer: Medicare Other | Admitting: Urgent Care

## 2015-11-21 ENCOUNTER — Encounter: Payer: Self-pay | Admitting: Urgent Care

## 2015-11-21 VITALS — BP 145/91 | HR 89 | Temp 97.9°F | Resp 16 | Ht 69.0 in | Wt 231.0 lb

## 2015-11-21 DIAGNOSIS — E669 Obesity, unspecified: Secondary | ICD-10-CM | POA: Diagnosis not present

## 2015-11-21 DIAGNOSIS — Z801 Family history of malignant neoplasm of trachea, bronchus and lung: Secondary | ICD-10-CM

## 2015-11-21 DIAGNOSIS — I10 Essential (primary) hypertension: Secondary | ICD-10-CM

## 2015-11-21 DIAGNOSIS — F17200 Nicotine dependence, unspecified, uncomplicated: Secondary | ICD-10-CM

## 2015-11-21 DIAGNOSIS — J4540 Moderate persistent asthma, uncomplicated: Secondary | ICD-10-CM

## 2015-11-21 MED ORDER — ALBUTEROL SULFATE HFA 108 (90 BASE) MCG/ACT IN AERS: 2.0000 | INHALATION_SPRAY | Freq: Four times a day (QID) | RESPIRATORY_TRACT | Status: AC | PRN

## 2015-11-21 NOTE — Patient Instructions (Signed)
Smoking Cessation, Tips for Success If you are ready to quit smoking, congratulations! You have chosen to help yourself be healthier. Cigarettes bring nicotine, tar, carbon monoxide, and other irritants into your body. Your lungs, heart, and blood vessels will be able to work better without these poisons. There are many different ways to quit smoking. Nicotine gum, nicotine patches, a nicotine inhaler, or nicotine nasal spray can help with physical craving. Hypnosis, support groups, and medicines help break the habit of smoking. WHAT THINGS CAN I DO TO MAKE QUITTING EASIER?  Here are some tips to help you quit for good:  Pick a date when you will quit smoking completely. Tell all of your friends and family about your plan to quit on that date.  Do not try to slowly cut down on the number of cigarettes you are smoking. Pick a quit date and quit smoking completely starting on that day.  Throw away all cigarettes.   Clean and remove all ashtrays from your home, work, and car.  On a card, write down your reasons for quitting. Carry the card with you and read it when you get the urge to smoke.  Cleanse your body of nicotine. Drink enough water and fluids to keep your urine clear or pale yellow. Do this after quitting to flush the nicotine from your body.  Learn to predict your moods. Do not let a bad situation be your excuse to have a cigarette. Some situations in your life might tempt you into wanting a cigarette.  Never have "just one" cigarette. It leads to wanting another and another. Remind yourself of your decision to quit.  Change habits associated with smoking. If you smoked while driving or when feeling stressed, try other activities to replace smoking. Stand up when drinking your coffee. Brush your teeth after eating. Sit in a different chair when you read the paper. Avoid alcohol while trying to quit, and try to drink fewer caffeinated beverages. Alcohol and caffeine may urge you to  smoke.  Avoid foods and drinks that can trigger a desire to smoke, such as sugary or spicy foods and alcohol.  Ask people who smoke not to smoke around you.  Have something planned to do right after eating or having a cup of coffee. For example, plan to take a walk or exercise.  Try a relaxation exercise to calm you down and decrease your stress. Remember, you may be tense and nervous for the first 2 weeks after you quit, but this will pass.  Find new activities to keep your hands busy. Play with a pen, coin, or rubber band. Doodle or draw things on paper.  Brush your teeth right after eating. This will help cut down on the craving for the taste of tobacco after meals. You can also try mouthwash.   Use oral substitutes in place of cigarettes. Try using lemon drops, carrots, cinnamon sticks, or chewing gum. Keep them handy so they are available when you have the urge to smoke.  When you have the urge to smoke, try deep breathing.  Designate your home as a nonsmoking area.  If you are a heavy smoker, ask your health care provider about a prescription for nicotine chewing gum. It can ease your withdrawal from nicotine.  Reward yourself. Set aside the cigarette money you save and buy yourself something nice.  Look for support from others. Join a support group or smoking cessation program. Ask someone at home or at work to help you with your plan   to quit smoking.  Always ask yourself, "Do I need this cigarette or is this just a reflex?" Tell yourself, "Today, I choose not to smoke," or "I do not want to smoke." You are reminding yourself of your decision to quit.  Do not replace cigarette smoking with electronic cigarettes (commonly called e-cigarettes). The safety of e-cigarettes is unknown, and some may contain harmful chemicals.  If you relapse, do not give up! Plan ahead and think about what you will do the next time you get the urge to smoke. HOW WILL I FEEL WHEN I QUIT SMOKING? You  may have symptoms of withdrawal because your body is used to nicotine (the addictive substance in cigarettes). You may crave cigarettes, be irritable, feel very hungry, cough often, get headaches, or have difficulty concentrating. The withdrawal symptoms are only temporary. They are strongest when you first quit but will go away within 10-14 days. When withdrawal symptoms occur, stay in control. Think about your reasons for quitting. Remind yourself that these are signs that your body is healing and getting used to being without cigarettes. Remember that withdrawal symptoms are easier to treat than the major diseases that smoking can cause.  Even after the withdrawal is over, expect periodic urges to smoke. However, these cravings are generally short lived and will go away whether you smoke or not. Do not smoke! WHAT RESOURCES ARE AVAILABLE TO HELP ME QUIT SMOKING? Your health care provider can direct you to community resources or hospitals for support, which may include:  Group support.  Education.  Hypnosis.  Therapy.   This information is not intended to replace advice given to you by your health care provider. Make sure you discuss any questions you have with your health care provider.   Document Released: 07/17/2004 Document Revised: 11/09/2014 Document Reviewed: 04/06/2013 Elsevier Interactive Patient Education 2016 Elsevier Inc.  

## 2015-11-21 NOTE — Progress Notes (Signed)
    MRN: [id number] DOB: 07-06-1956  Subjective:   Isaac Contreras is a 60 y.o. male presenting for follow up on asthma. Reports that he has scheduled his albuterol use to twice a day. He admits that he is not having wheezing, shob, chest tightness but is using albuterol prophylacticly. He does have an intermittent occasional dry cough that some times causes mild mid-sternal chest pain. This occurs with cough only and lasts a split second. Of note, patient has quit smoking since his last visit. Otherwise, patient reports that he is doing very well.   Isaac Contreras has a current medication list which includes the following prescription(s): aspirin, cyclobenzaprine, hydrochlorothiazide, meloxicam, tramadol, and albuterol. Also has No Known Allergies.  Isaac Contreras  has a past medical history of Hypertension; Diabetes mellitus without complication ([place]); Acute MI ([place]); Bronchitis; HLD (hyperlipidemia); Diabetes ([place]); Tobacco abuse; and Asthma. Also  has past surgical history that includes Appendectomy and cyst removed off back.  Objective:   Vitals: BP 145/91 mmHg  Pulse 89  Temp(Src) 97.9 F (36.6 C) (Oral)  Resp 16  Ht 5\' 9"  (1.753 m)  Wt 231 lb (104.781 kg)  BMI 34.10 kg/m2  SpO2 99%  Physical Exam  Constitutional: He is oriented to person, place, and time. He appears well-developed and well-nourished.  HENT:  Mouth/Throat: Oropharynx is clear and moist.  Eyes: Right eye exhibits no discharge. Left eye exhibits no discharge. No scleral icterus.  Cardiovascular: Normal rate, regular rhythm and intact distal pulses.  Exam reveals no gallop and no friction rub.   No murmur heard. Pulmonary/Chest: No respiratory distress. He has no wheezes. He has no rales.  Neurological: He is alert and oriented to person, place, and time.  Skin: Skin is warm and dry.   Assessment and Plan :   1. Moderate persistent asthma without complication 2. Tobacco use disorder 3. Family history of lung cancer - Counseled  patient albuterol use. I suspect that it might be affecting his blood pressure. Patient is to stop using his albuterol daily and will only use it if he gets wheezing, shob, chest tightness. I encouraged patient on his efforts with smoking cessation.  - Last cxr 10/15/2014 was completely normal. Consider chest x-ray at f/u or sooner if cough worsens or persists. Patient agreed.  4. Essential hypertension 5. Obesity - Stable, encouraged patient to continue dietary modifications and exercise. He will continue HCTZ 12.5mg , 81mg  aspirin. Recheck in 6 months.  Jaynee Eagles, PA-C [facility] [phone] 11/21/2015 4:30 PM

## 2015-11-22 ENCOUNTER — Encounter: Payer: Medicare Other | Admitting: Family Medicine

## 2015-11-28 ENCOUNTER — Ambulatory Visit: Payer: Medicare Other | Admitting: Cardiology

## 2015-11-30 DIAGNOSIS — M5136 Other intervertebral disc degeneration, lumbar region: Secondary | ICD-10-CM | POA: Diagnosis not present

## 2015-11-30 DIAGNOSIS — G894 Chronic pain syndrome: Secondary | ICD-10-CM | POA: Diagnosis not present

## 2015-11-30 DIAGNOSIS — S134XXS Sprain of ligaments of cervical spine, sequela: Secondary | ICD-10-CM | POA: Diagnosis not present

## 2015-12-03 ENCOUNTER — Encounter: Payer: Self-pay | Admitting: Cardiology

## 2015-12-10 DIAGNOSIS — M545 Low back pain: Secondary | ICD-10-CM | POA: Diagnosis not present

## 2015-12-12 ENCOUNTER — Ambulatory Visit (AMBULATORY_SURGERY_CENTER): Payer: Self-pay

## 2015-12-12 VITALS — Ht 69.0 in | Wt 229.0 lb

## 2015-12-12 DIAGNOSIS — Z1211 Encounter for screening for malignant neoplasm of colon: Secondary | ICD-10-CM

## 2015-12-12 NOTE — Progress Notes (Signed)
No egg or soy allergies Not on home 02 No previous anesthesia complications No diet or weight loss meds 

## 2015-12-17 DIAGNOSIS — M19022 Primary osteoarthritis, left elbow: Secondary | ICD-10-CM | POA: Diagnosis not present

## 2015-12-26 ENCOUNTER — Encounter: Payer: Medicare Other | Admitting: Internal Medicine

## 2015-12-26 ENCOUNTER — Telehealth: Payer: Self-pay | Admitting: Internal Medicine

## 2015-12-26 NOTE — Telephone Encounter (Signed)
Noted We will see if he reschedules

## 2016-01-15 ENCOUNTER — Telehealth: Payer: Self-pay

## 2016-01-15 ENCOUNTER — Other Ambulatory Visit: Payer: Self-pay

## 2016-01-15 MED ORDER — GLUCOSE BLOOD VI STRP: ORAL_STRIP | Status: DC

## 2016-01-15 MED ORDER — LANCETS MISC: Status: DC

## 2016-01-15 NOTE — Telephone Encounter (Signed)
Pt states he have the machine to check for his diabetics but need to have the test scripts and needles also called in. Please call pt at [phone]     [facility] [address]

## 2016-01-15 NOTE — Telephone Encounter (Signed)
Done

## 2016-01-15 NOTE — Telephone Encounter (Signed)
Re sent with DX 

## 2016-01-16 ENCOUNTER — Other Ambulatory Visit: Payer: Self-pay

## 2016-01-16 ENCOUNTER — Telehealth: Payer: Self-pay | Admitting: Urgent Care

## 2016-01-16 MED ORDER — GLUCOSE BLOOD VI STRP: ORAL_STRIP | Status: DC

## 2016-01-16 MED ORDER — LANCETS MISC: Status: AC

## 2016-01-16 MED ORDER — LANCETS MISC: Status: DC

## 2016-01-16 NOTE — Telephone Encounter (Signed)
[facility] [address] [facility] script for his diabetic strips but there was no diagnostic code for the strips and for the needle he prick his finger with. Please call the pharmacy to fix the problem. [phone].

## 2016-01-16 NOTE — Telephone Encounter (Signed)
Done

## 2016-02-12 ENCOUNTER — Ambulatory Visit (AMBULATORY_SURGERY_CENTER): Payer: Medicare Other | Admitting: Internal Medicine

## 2016-02-12 ENCOUNTER — Encounter: Payer: Self-pay | Admitting: Internal Medicine

## 2016-02-12 VITALS — BP 113/65 | HR 70 | Temp 98.4°F | Resp 18 | Ht 69.0 in | Wt 229.0 lb

## 2016-02-12 DIAGNOSIS — D122 Benign neoplasm of ascending colon: Secondary | ICD-10-CM

## 2016-02-12 DIAGNOSIS — I252 Old myocardial infarction: Secondary | ICD-10-CM | POA: Diagnosis not present

## 2016-02-12 DIAGNOSIS — Z1211 Encounter for screening for malignant neoplasm of colon: Secondary | ICD-10-CM

## 2016-02-12 DIAGNOSIS — E119 Type 2 diabetes mellitus without complications: Secondary | ICD-10-CM | POA: Diagnosis not present

## 2016-02-12 DIAGNOSIS — I10 Essential (primary) hypertension: Secondary | ICD-10-CM | POA: Diagnosis not present

## 2016-02-12 DIAGNOSIS — J42 Unspecified chronic bronchitis: Secondary | ICD-10-CM | POA: Diagnosis not present

## 2016-02-12 DIAGNOSIS — I2510 Atherosclerotic heart disease of native coronary artery without angina pectoris: Secondary | ICD-10-CM | POA: Diagnosis not present

## 2016-02-12 MED ORDER — SODIUM CHLORIDE 0.9 % IV SOLN: 500.0000 mL | INTRAVENOUS | Status: DC

## 2016-02-12 NOTE — Patient Instructions (Addendum)
I found and removed 2 tiny polyps that look benign. I will let you know pathology results and when to have another routine colonoscopy by mail.  You also have a condition called diverticulosis - common and not usually a problem. Please read the handout provided.  I appreciate the opportunity to care for you. Gatha Mayer, MD, FACG    YOU HAD AN ENDOSCOPIC PROCEDURE TODAY AT [facility]:   Refer to the procedure report that was given to you for any specific questions about what was found during the examination.  If the procedure report does not answer your questions, please call your gastroenterologist to clarify.  If you requested that your care partner not be given the details of your procedure findings, then the procedure report has been included in a sealed envelope for you to review at your convenience later.  YOU SHOULD EXPECT: Some feelings of bloating in the abdomen. Passage of more gas than usual.  Walking can help get rid of the air that was put into your GI tract during the procedure and reduce the bloating. If you had a lower endoscopy (such as a colonoscopy or flexible sigmoidoscopy) you may notice spotting of blood in your stool or on the toilet paper. If you underwent a bowel prep for your procedure, you may not have a normal bowel movement for a few days.  Please Note:  You might notice some irritation and congestion in your nose or some drainage.  This is from the oxygen used during your procedure.  There is no need for concern and it should clear up in a day or so.  SYMPTOMS TO REPORT IMMEDIATELY:   Following lower endoscopy (colonoscopy or flexible sigmoidoscopy):  Excessive amounts of blood in the stool  Significant tenderness or worsening of abdominal pains  Swelling of the abdomen that is new, acute  Fever of 100F or higher   For urgent or emergent issues, a gastroenterologist can be reached at any hour by calling [phone].   DIET:  Your first meal following the procedure should be a small meal and then it is ok to progress to your normal diet. Heavy or fried foods are harder to digest and may make you feel nauseous or bloated.  Likewise, meals heavy in dairy and vegetables can increase bloating.  Drink plenty of fluids but you should avoid alcoholic beverages for 24 hours.  ACTIVITY:  You should plan to take it easy for the rest of today and you should NOT DRIVE or use heavy machinery until tomorrow (because of the sedation medicines used during the test).    FOLLOW UP: Our staff will call the number listed on your records the next business day following your procedure to check on you and address any questions or concerns that you may have regarding the information given to you following your procedure. If we do not reach you, we will leave a message.  However, if you are feeling well and you are not experiencing any problems, there is no need to return our call.  We will assume that you have returned to your regular daily activities without incident.  If any biopsies were taken you will be contacted by phone or by letter within the next 1-3 weeks.  Please call us at [phone] if you have not heard about the biopsies in 3 weeks.    SIGNATURES/CONFIDENTIALITY: You and/or your care partner have signed paperwork which will be entered into your electronic medical record.  These signatures attest to the fact that that the information above on your After Visit Summary has been reviewed and is understood.  Full responsibility of the confidentiality of this discharge information lies with you and/or your care-partner.    Handouts were given to your care partner on polyps and diverticulosis. You may resume your current medications today. Await biopsy results. Please call if any questions or concerns.

## 2016-02-12 NOTE — Progress Notes (Signed)
Report given to PACU RN, vs

## 2016-02-12 NOTE — Progress Notes (Signed)
No problems noted in the recovery room. maw 

## 2016-02-12 NOTE — Progress Notes (Signed)
Called to room to assist during endoscopic procedure.  Patient ID and intended procedure confirmed with present staff. Received instructions for my participation in the procedure from the performing physician.  

## 2016-02-12 NOTE — Op Note (Signed)
[name] Patient Name: Isaac Contreras Procedure Date: 02/12/2016 2:06 PM MRN: [id number] Endoscopist: Gatha Mayer , MD Age: 60 Date of Birth: Mar 10, 1956 Gender: Male Procedure:                Colonoscopy Indications:              Screening for colorectal malignant neoplasm Medicines:                Propofol per Anesthesia, Monitored Anesthesia Care Procedure:                Pre-Anesthesia Assessment:                           - Prior to the procedure, a History and Physical                            was performed, and patient medications and                            allergies were reviewed. The patient's tolerance of                            previous anesthesia was also reviewed. The risks                            and benefits of the procedure and the sedation                            options and risks were discussed with the patient.                            All questions were answered, and informed consent                            was obtained. ASA Grade Assessment: III - A patient                            with severe systemic disease. After reviewing the                            risks and benefits, the patient was deemed in                            satisfactory condition to undergo the procedure.                           After obtaining informed consent, the colonoscope                            was passed under direct vision. Throughout the                            procedure, the patient's blood pressure, pulse, and  oxygen saturations were monitored continuously. The                            Model CF-HQ190L [id number] scope was introduced                            through the anus and advanced to the the cecum,                            identified by appendiceal orifice and ileocecal                            valve. Anatomical landmarks were photographed. The                            quality of the bowel  preparation was good. The                            bowel preparation used was Miralax. Scope In: 2:22:56 PM Scope Out: 2:35:31 PM Scope Withdrawal Time: 0 hours 10 minutes 26 seconds  Total Procedure Duration: 0 hours 12 minutes 35 seconds  Findings:                 Two sessile polyps were found in the ascending                            colon. The polyps were 2 to 4 mm in size. These                            polyps were removed with a cold snare. Resection                            and retrieval were complete.                           Multiple diverticula were found in the entire                            colon. There was no evidence of diverticular                            bleeding.                           The exam was otherwise without abnormality on                            direct and retroflexion views. Complications:            No immediate complications. Estimated blood loss:                            None. Estimated Blood Loss:     Estimated blood loss: none. Impression:               - Two 2  to 4 mm polyps in the ascending colon,                            removed with a cold snare. Resected and retrieved.                           - Mild diverticulosis in the entire examined colon.                            There was no evidence of diverticular bleeding.                           - The examination was otherwise normal on direct                            and retroflexion views. Recommendation:           - Repeat colonoscopy date to be determined after                            pending pathology results are reviewed for                            surveillance.                           - Resume previous diet.                           - Written discharge instructions were provided to                            the patient.                           - The signs and symptoms of potential delayed                            complications were discussed with the  patient.                           - Patient has a contact number available for                            emergencies.                           - Return to normal activities tomorrow.                           - Continue present medications. Gatha Mayer, MD 02/12/2016 2:45:52 PM This report has been signed electronically. CC Letter to:             Jaynee Eagles

## 2016-02-13 ENCOUNTER — Telehealth: Payer: Self-pay

## 2016-02-13 NOTE — Telephone Encounter (Signed)
  Follow up Call-  Call back number 02/12/2016  Post procedure Call Back phone  # [phone]  Permission to leave phone message No    Patient was called for follow up after his procedure on 02/12/2016. No answer at the number given for follow up phone call. I did not leave a message per patients request.

## 2016-02-22 ENCOUNTER — Encounter: Payer: Self-pay | Admitting: Internal Medicine

## 2016-02-22 DIAGNOSIS — Z860101 Personal history of adenomatous and serrated colon polyps: Secondary | ICD-10-CM

## 2016-02-22 DIAGNOSIS — Z86010 Personal history of colonic polyps: Secondary | ICD-10-CM | POA: Insufficient documentation

## 2016-02-22 HISTORY — DX: Personal history of colonic polyps: Z86.010

## 2016-02-22 HISTORY — DX: Personal history of adenomatous and serrated colon polyps: Z86.0101

## 2016-02-22 NOTE — Progress Notes (Signed)
Quick Note:  2 diminutive adenomas - recall 2022 ______ 

## 2016-03-13 NOTE — Telephone Encounter (Signed)
close

## 2016-05-07 ENCOUNTER — Telehealth: Payer: Self-pay

## 2016-05-07 NOTE — Telephone Encounter (Signed)
[facility] [address] [facility] test [facility]  Please send with all the pertinent information   Fax [phone]

## 2016-05-08 MED ORDER — GLUCOSE BLOOD VI STRP: ORAL_STRIP | Status: DC

## 2016-05-08 NOTE — Telephone Encounter (Signed)
Sent in new Rx.

## 2016-05-14 ENCOUNTER — Telehealth: Payer: Self-pay

## 2016-05-14 MED ORDER — GLUCOSE BLOOD VI STRP: ORAL_STRIP | Status: DC

## 2016-05-14 NOTE — Telephone Encounter (Signed)
Pt is needing for [place] to call pharmacy and give diagnosis code so that medicaid will pay for test streps  Best number [phone]

## 2016-05-14 NOTE — Telephone Encounter (Signed)
Sent another one electronically with diagnosis code.

## 2016-05-14 NOTE — Telephone Encounter (Signed)
Done

## 2016-05-21 ENCOUNTER — Other Ambulatory Visit: Payer: Self-pay

## 2016-05-21 ENCOUNTER — Ambulatory Visit: Payer: Medicare Other | Admitting: Urgent Care

## 2016-05-21 MED ORDER — ACCU-CHEK AVIVA DEVI: Status: AC

## 2016-05-21 MED ORDER — GLUCOSE BLOOD VI STRP: ORAL_STRIP | Status: AC

## 2016-05-21 MED ORDER — ACCU-CHEK SAFE-T PRO LANCETS MISC: Status: AC

## 2016-05-21 NOTE — Telephone Encounter (Signed)
all supplies ordered electronically.

## 2016-05-21 NOTE — Telephone Encounter (Signed)
Pt needs Accu-chek strips, lancets and machine - [employer] denied any other brand - called [facility] [address]-spoke to pharmacist and she instructed to order [place].

## 2016-12-14 ENCOUNTER — Encounter (HOSPITAL_COMMUNITY): Payer: Self-pay | Admitting: *Deleted

## 2016-12-14 ENCOUNTER — Ambulatory Visit (HOSPITAL_COMMUNITY)
Admission: EM | Admit: 2016-12-14 | Discharge: 2016-12-14 | Disposition: A | Discharge status: Home / Self Care | Payer: Medicare HMO | Attending: Internal Medicine | Admitting: Internal Medicine

## 2016-12-14 DIAGNOSIS — K047 Periapical abscess without sinus: Secondary | ICD-10-CM

## 2016-12-14 MED ORDER — CLINDAMYCIN HCL 300 MG PO CAPS: 300.0000 mg | ORAL_CAPSULE | Freq: Four times a day (QID) | ORAL | 0 refills | Status: AC

## 2016-12-14 MED ORDER — HYDROCODONE-ACETAMINOPHEN 5-325 MG PO TABS: 1.0000 | ORAL_TABLET | Freq: Four times a day (QID) | ORAL | 0 refills | Status: DC | PRN

## 2016-12-14 NOTE — Discharge Instructions (Signed)
You have a dental abscess that will need to be drained. I recommend you follow up with a dentist of your choice for further management. I have prescribed an antibiotic called Clindamycin. Take 1 tablet 4 times a day. This medicine can be very hard on your stomach, I recommend you take probiotics or eat yogurt daily while taking.  For pain, I have prescribed hydocodone, take 1-2 tabets every 6 hours as needed for pain. This medicine is a narcotic and it is addictive, do not take for more than 5 days. It will cause drowsiness, do not drink or drive while taking. These treatments will only help the symptoms and they will not cure your condition. You will need to see a dentist for follow up care. We will be unable to extend your pain management beyond what is written for today.

## 2016-12-14 NOTE — ED Provider Notes (Signed)
CSN: [id number]     Arrival date & time 12/14/16  [id number] History   First MD Initiated Contact with Patient 12/14/16 1028     Chief Complaint  Patient presents with  . Dental Pain   (Consider location/radiation/quality/duration/timing/severity/associated sxs/prior Treatment) 3-4 day history of gum/tooth pain. Denies fever, chills, nausea, headache, or signs of systemic illness.   The history is provided by the patient.  Dental Pain  Location:  Upper Upper teeth location:  7/RU lateral incisor Quality:  Constant, sharp, throbbing and pressure-like Severity:  Severe Onset quality:  Gradual Duration:  4 days Timing:  Constant Progression:  Worsening Chronicity:  New Context: abscess and dental caries   Relieved by:  Nothing Worsened by:  Touching and pressure Ineffective treatments:  Acetaminophen and NSAIDs Associated symptoms: facial pain and gum swelling   Associated symptoms: no congestion, no difficulty swallowing, no drooling, no facial swelling, no fever, no headaches, no neck pain, no neck swelling, no oral bleeding, no oral lesions and no trismus     Past Medical History:  Diagnosis Date  . Acute MI 1994   reports prior MI in [place]  . Arthritis   . Asthma   . Bronchitis   . Diabetes ([place])    diet controlled   . Diabetes mellitus without complication ([place])   . HLD (hyperlipidemia)   . Hx of adenomatous colonic polyps 02/22/2016  . Hypertension   . Tobacco abuse    Past Surgical History:  Procedure Laterality Date  . APPENDECTOMY    . COLONOSCOPY  09/07/2008   [place], [place] tics, hems  . cyst removed off back     Family History  Problem Relation Age of Onset  . Cancer Mother   . Colon cancer Neg Hx    Social History  Substance Use Topics  . Smoking status: Former Smoker    Packs/day: 0.50    Years: 15.00    Types: Cigarettes    Quit date: 11/27/2015  . Smokeless tobacco: Never Used  . Alcohol use No     Comment: occasionall beer    Review of Systems    Reason unable to perform ROS: as covered in HPI.  Constitutional: Negative for fever.  HENT: Negative for congestion, drooling, facial swelling and mouth sores.   Musculoskeletal: Negative for neck pain.  Neurological: Negative for headaches.  All other systems reviewed and are negative.   Allergies  Patient has no known allergies.  Home Medications   Prior to Admission medications   Medication Sig Start Date End Date Taking? Authorizing Provider  albuterol (PROVENTIL HFA;VENTOLIN HFA) 108 (90 Base) MCG/ACT inhaler Inhale 2 puffs into the lungs every 6 (six) hours as needed for wheezing. 11/21/15  Yes Jaynee Eagles, PA-C  amLODipine (NORVASC) 10 MG tablet Take 10 mg by mouth daily.   Yes Historical Provider, MD  aspirin 81 MG tablet Take 81 mg by mouth daily.   Yes Historical Provider, MD  Blood Glucose Monitoring Suppl (ACCU-CHEK AVIVA) device Test glucose with fingerstick once daily.  Diag: E11.9 05/21/16 05/21/17 Yes Jaynee Eagles, PA-C  cyclobenzaprine (FLEXERIL) 10 MG tablet Take 1 tablet (10 mg total) by mouth at bedtime. 10/26/15  Yes Leandrew Koyanagi, MD  glucose blood (ACCU-CHEK AVIVA) test strip Test glucose with fingerstick once daily.  Diag: E11.9 05/21/16  Yes Jaynee Eagles, PA-C  hydrochlorothiazide (MICROZIDE) 12.5 MG capsule Take 1 capsule (12.5 mg total) by mouth daily. 11/07/15  Yes Jaynee Eagles, PA-C  Lancets (ACCU-CHEK SAFE-T PRO) lancets Test glucose  with fingerstick once daily. Diag: E11.9 05/21/16  Yes Jaynee Eagles, PA-C  Lancets MISC Check blood sugar three times daily. 01/16/16  Yes Jaynee Eagles, PA-C  meloxicam (MOBIC) 15 MG tablet Take 1 tablet (15 mg total) by mouth daily. 10/26/15  Yes Leandrew Koyanagi, MD  clindamycin (CLEOCIN) 300 MG capsule Take 1 capsule (300 mg total) by mouth 4 (four) times daily. 12/14/16 12/21/16  Barnet Glasgow, NP  HYDROcodone-acetaminophen (NORCO/VICODIN) 5-325 MG tablet Take 1-2 tablets by mouth every 6 (six) hours as needed for moderate pain.  12/14/16   Barnet Glasgow, NP   Meds Ordered and Administered this Visit  Medications - No data to display  Pulse 80   Temp 98 F (36.7 C) (Oral)   Resp 17   SpO2 100%  No data found.   Physical Exam  Constitutional: He is oriented to person, place, and time. He appears well-developed and well-nourished. No distress.  HENT:  Head: Normocephalic and atraumatic.  Right Ear: Tympanic membrane normal.  Left Ear: Tympanic membrane normal.  Nose: Nose normal. Right sinus exhibits no maxillary sinus tenderness. Left sinus exhibits no maxillary sinus tenderness and no frontal sinus tenderness.  Mouth/Throat:    Neck: Normal range of motion. Neck supple. No JVD present.  Cardiovascular: Normal rate and regular rhythm.   Pulmonary/Chest: Effort normal and breath sounds normal.  Lymphadenopathy:    He has no cervical adenopathy.  Neurological: He is alert and oriented to person, place, and time.  Skin: Skin is warm. Capillary refill takes less than 2 seconds. He is not diaphoretic.  Psychiatric: He has a normal mood and affect.  Nursing note and vitals reviewed.   Urgent Care Course     Procedures (including critical care time)  Labs Review Labs Reviewed - No data to display  Imaging Review No results found.   Visual Acuity Review  Right Eye Distance:   Left Eye Distance:   Bilateral Distance:    Right Eye Near:   Left Eye Near:    Bilateral Near:         MDM   1. Dental abscess   You have a dental abscess that will need to be drained. I recommend you follow up with a dentist of your choice for further management. I have prescribed an antibiotic called Clindamycin. Take 1 tablet 4 times a day. This medicine can be very hard on your stomach, I recommend you take probiotics or eat yogurt daily while taking.  For pain, I have prescribed hydocodone, take 1-2 tabets every 6 hours as needed for pain. This medicine is a narcotic and it is addictive, do not take for more  than 5 days. It will cause drowsiness, do not drink or drive while taking. These treatments will only help the symptoms and they will not cure your condition. You will need to see a dentist for follow up care. We will be unable to extend your pain management beyond what is written for today.   [facility] Controlled Substances Registry consulted prior to writing prescription. No controlled medications written in last 6 months.     Barnet Glasgow, NP 12/14/16 1103

## 2016-12-14 NOTE — ED Triage Notes (Signed)
Patient states 3 day history of right upper gum pain.

## 2018-03-31 DIAGNOSIS — E559 Vitamin D deficiency, unspecified: Secondary | ICD-10-CM | POA: Diagnosis not present

## 2018-03-31 DIAGNOSIS — Z79899 Other long term (current) drug therapy: Secondary | ICD-10-CM | POA: Diagnosis not present

## 2018-03-31 DIAGNOSIS — M545 Low back pain: Secondary | ICD-10-CM | POA: Diagnosis not present

## 2018-03-31 DIAGNOSIS — J4520 Mild intermittent asthma, uncomplicated: Secondary | ICD-10-CM | POA: Diagnosis not present

## 2018-03-31 DIAGNOSIS — I10 Essential (primary) hypertension: Secondary | ICD-10-CM | POA: Diagnosis not present

## 2018-03-31 DIAGNOSIS — E669 Obesity, unspecified: Secondary | ICD-10-CM | POA: Diagnosis not present

## 2018-05-12 DIAGNOSIS — Z0001 Encounter for general adult medical examination with abnormal findings: Secondary | ICD-10-CM | POA: Diagnosis not present

## 2018-05-12 DIAGNOSIS — Z1211 Encounter for screening for malignant neoplasm of colon: Secondary | ICD-10-CM | POA: Diagnosis not present

## 2018-05-12 DIAGNOSIS — Z79899 Other long term (current) drug therapy: Secondary | ICD-10-CM | POA: Diagnosis not present

## 2018-05-12 DIAGNOSIS — E559 Vitamin D deficiency, unspecified: Secondary | ICD-10-CM | POA: Diagnosis not present

## 2018-05-12 DIAGNOSIS — I10 Essential (primary) hypertension: Secondary | ICD-10-CM | POA: Diagnosis not present

## 2018-05-12 DIAGNOSIS — E1165 Type 2 diabetes mellitus with hyperglycemia: Secondary | ICD-10-CM | POA: Diagnosis not present

## 2018-05-12 DIAGNOSIS — M1710 Unilateral primary osteoarthritis, unspecified knee: Secondary | ICD-10-CM | POA: Diagnosis not present

## 2018-05-12 DIAGNOSIS — Z131 Encounter for screening for diabetes mellitus: Secondary | ICD-10-CM | POA: Diagnosis not present

## 2018-05-12 DIAGNOSIS — M545 Low back pain: Secondary | ICD-10-CM | POA: Diagnosis not present

## 2018-05-12 DIAGNOSIS — Z125 Encounter for screening for malignant neoplasm of prostate: Secondary | ICD-10-CM | POA: Diagnosis not present

## 2018-09-05 ENCOUNTER — Ambulatory Visit
Admission: RE | Admit: 2018-09-05 | Discharge: 2018-09-05 | Disposition: A | Discharge status: Home / Self Care | Payer: Medicare HMO | Source: Ambulatory Visit | Attending: Family | Admitting: Family

## 2018-09-05 ENCOUNTER — Other Ambulatory Visit: Payer: Self-pay | Admitting: Family

## 2018-09-05 DIAGNOSIS — Z87891 Personal history of nicotine dependence: Secondary | ICD-10-CM | POA: Diagnosis not present

## 2020-03-07 ENCOUNTER — Encounter (HOSPITAL_COMMUNITY): Payer: Self-pay

## 2020-03-07 ENCOUNTER — Other Ambulatory Visit: Payer: Self-pay

## 2020-03-07 ENCOUNTER — Ambulatory Visit (HOSPITAL_COMMUNITY)
Admission: EM | Admit: 2020-03-07 | Discharge: 2020-03-07 | Disposition: A | Discharge status: Home / Self Care | Payer: Medicare HMO | Attending: Emergency Medicine | Admitting: Emergency Medicine

## 2020-03-07 ENCOUNTER — Encounter: Payer: Self-pay | Admitting: Nurse Practitioner

## 2020-03-07 DIAGNOSIS — R1310 Dysphagia, unspecified: Secondary | ICD-10-CM | POA: Insufficient documentation

## 2020-03-07 DIAGNOSIS — Z7982 Long term (current) use of aspirin: Secondary | ICD-10-CM | POA: Insufficient documentation

## 2020-03-07 DIAGNOSIS — J45909 Unspecified asthma, uncomplicated: Secondary | ICD-10-CM | POA: Insufficient documentation

## 2020-03-07 DIAGNOSIS — Z79899 Other long term (current) drug therapy: Secondary | ICD-10-CM | POA: Insufficient documentation

## 2020-03-07 DIAGNOSIS — I252 Old myocardial infarction: Secondary | ICD-10-CM | POA: Insufficient documentation

## 2020-03-07 DIAGNOSIS — Z87891 Personal history of nicotine dependence: Secondary | ICD-10-CM | POA: Insufficient documentation

## 2020-03-07 DIAGNOSIS — E119 Type 2 diabetes mellitus without complications: Secondary | ICD-10-CM | POA: Diagnosis not present

## 2020-03-07 DIAGNOSIS — I10 Essential (primary) hypertension: Secondary | ICD-10-CM | POA: Insufficient documentation

## 2020-03-07 DIAGNOSIS — Z86010 Personal history of colonic polyps: Secondary | ICD-10-CM | POA: Insufficient documentation

## 2020-03-07 LAB — CBC WITH DIFFERENTIAL/PLATELET
Abs Immature Granulocytes: 0.07 10*3/uL (ref 0.00–0.07)
Basophils Absolute: 0.1 10*3/uL (ref 0.0–0.1)
Basophils Relative: 1 %
Eosinophils Absolute: 0.4 10*3/uL (ref 0.0–0.5)
Eosinophils Relative: 4 %
HCT: 42.1 % (ref 39.0–52.0)
Hemoglobin: 13.9 g/dL (ref 13.0–17.0)
Immature Granulocytes: 1 %
Lymphocytes Relative: 26 %
Lymphs Abs: 2.5 10*3/uL (ref 0.7–4.0)
MCH: 29.3 pg (ref 26.0–34.0)
MCHC: 33 g/dL (ref 30.0–36.0)
MCV: 88.8 fL (ref 80.0–100.0)
Monocytes Absolute: 0.6 10*3/uL (ref 0.1–1.0)
Monocytes Relative: 7 %
Neutro Abs: 5.8 10*3/uL (ref 1.7–7.7)
Neutrophils Relative %: 61 %
Platelets: 365 10*3/uL (ref 150–400)
RBC: 4.74 MIL/uL (ref 4.22–5.81)
RDW: 14.4 % (ref 11.5–15.5)
WBC: 9.4 10*3/uL (ref 4.0–10.5)
nRBC: 0 % (ref 0.0–0.2)

## 2020-03-07 LAB — TSH: TSH: 0.671 u[IU]/mL (ref 0.350–4.500)

## 2020-03-07 MED ORDER — OMEPRAZOLE 20 MG PO CPDR
20.0000 mg | DELAYED_RELEASE_CAPSULE | Freq: Two times a day (BID) | ORAL | 0 refills | Status: DC
Start: 2020-03-07 — End: 2023-03-30

## 2020-03-07 NOTE — ED Provider Notes (Signed)
[place]    CSN: [id number] Arrival date & time: 03/07/20  1100      History   Chief Complaint Chief Complaint  Patient presents with  . Trouble swollowing    HPI Isaac Contreras is a 64 y.o. male history of DM type II, hypertension, tobacco use, presenting today for evaluation of dysphagia.  Patient notes that over the past month he has had a sensation of something being stuck in her throat.  He feels this sensation all the time as well as with swallowing, eating and drinking.  He notices this with solids and liquids.  He denies any associated pain with it.  He expresses concern over possible cancer as he reports significant history of tobacco use and cigarette smoking.  Reports approximately half pack per day.  He denies any chest pain, shortness of breath.  Denies any nausea vomiting or abdominal pain.  Does report history of reflux and acid.  Not currently on medicine for this.  He denies any URI symptoms.  HPI  Past Medical History:  Diagnosis Date  . Acute MI ([place]) 1994   reports prior MI in [place]  . Arthritis   . Asthma   . Bronchitis   . Diabetes ([place])    diet controlled   . Diabetes mellitus without complication ([place])   . HLD (hyperlipidemia)   . Hx of adenomatous colonic polyps 02/22/2016  . Hypertension   . Tobacco abuse     Patient Active Problem List   Diagnosis Date Noted  . Hx of adenomatous colonic polyps 02/22/2016  . Chest pain 02/05/2014  . Dyspnea 02/05/2014  . Hypertension   . Diabetes mellitus without complication ([place])   . Acute MI ([place])   . Bronchitis     Past Surgical History:  Procedure Laterality Date  . APPENDECTOMY    . COLONOSCOPY  09/07/2008   [place], [place] tics, hems  . cyst removed off back         Home Medications    Prior to Admission medications   Medication Sig Start Date End Date Taking? Authorizing Provider  albuterol (PROVENTIL HFA;VENTOLIN HFA) 108 (90 Base) MCG/ACT inhaler Inhale 2 puffs into the lungs every 6  (six) hours as needed for wheezing. 11/21/15   Jaynee Eagles, PA-C  amLODipine (NORVASC) 10 MG tablet Take 10 mg by mouth daily.    [provider]  aspirin 81 MG tablet Take 81 mg by mouth daily.    [provider]  glucose blood (ACCU-CHEK AVIVA) test strip Test glucose with fingerstick once daily.  Diag: E11.9 05/21/16   Jaynee Eagles, PA-C  hydrochlorothiazide (MICROZIDE) 12.5 MG capsule Take 1 capsule (12.5 mg total) by mouth daily. 11/07/15   Jaynee Eagles, PA-C  Lancets (ACCU-CHEK SAFE-T PRO) lancets Test glucose with fingerstick once daily. Diag: E11.9 05/21/16   Jaynee Eagles, PA-C  Lancets MISC Check blood sugar three times daily. 01/16/16   Jaynee Eagles, PA-C  omeprazole (PRILOSEC) 20 MG capsule Take 1 capsule (20 mg total) by mouth 2 (two) times daily before a meal for 14 days. 03/07/20 03/21/20  , Elesa Hacker, PA-C    Family History Family History  Problem Relation Age of Onset  . Cancer Mother   . Colon cancer Neg Hx     Social History Social History   Tobacco Use  . Smoking status: Former Smoker    Packs/day: 0.50    Years: 15.00    Pack years: 7.50    Types: Cigarettes  Quit date: 11/27/2015    Years since quitting: 4.2  . Smokeless tobacco: Never Used  Substance Use Topics  . Alcohol use: No    Alcohol/week: 0.0 standard drinks    Comment: occasionall beer  . Drug use: No     Allergies   Patient has no known allergies.   Review of Systems Review of Systems  Constitutional: Negative for activity change, appetite change, chills, fatigue and fever.  HENT: Negative for congestion, ear pain, rhinorrhea, sinus pressure, sore throat and trouble swallowing.   Eyes: Negative for discharge and redness.  Respiratory: Negative for cough, chest tightness and shortness of breath.   Cardiovascular: Negative for chest pain.  Gastrointestinal: Negative for abdominal pain, diarrhea, nausea and vomiting.  Musculoskeletal: Negative for myalgias.  Skin: Negative for  rash.  Neurological: Negative for dizziness, light-headedness and headaches.     Physical Exam Triage Vital Signs ED Triage Vitals  Enc Vitals Group     BP 03/07/20 1121 (!) 159/96     Pulse Rate 03/07/20 1121 (!) 105     Resp 03/07/20 1121 19     Temp 03/07/20 1121 98.1 F (36.7 C)     Temp Source 03/07/20 1121 Oral     SpO2 03/07/20 1121 94 %     Weight 03/07/20 1119 225 lb (102.1 kg)     Height --      Head Circumference --      Peak Flow --      Pain Score 03/07/20 1119 0     Pain Loc --      Pain Edu? --      Excl. in [place]? --    No data found.  Updated Vital Signs BP (!) 159/96 (BP Location: Right Arm)   Pulse (!) 105   Temp 98.1 F (36.7 C) (Oral)   Resp 19   Wt 225 lb (102.1 kg)   SpO2 94%   BMI 33.23 kg/m   Visual Acuity Right Eye Distance:   Left Eye Distance:   Bilateral Distance:    Right Eye Near:   Left Eye Near:    Bilateral Near:     Physical Exam Vitals and nursing note reviewed.  Constitutional:      Appearance: He is well-developed.     Comments: No acute distress  HENT:     Head: Normocephalic and atraumatic.     Ears:     Comments: Bilateral ears without tenderness to palpation of external auricle, tragus and mastoid, EAC's without erythema or swelling, TM's with good bony landmarks and cone of light. Non erythematous.     Nose: Nose normal.     Mouth/Throat:     Comments: Oral mucosa pink and moist, no tonsillar enlargement or exudate. Posterior pharynx patent and nonerythematous, no uvula deviation or swelling. Normal phonation. Eyes:     Conjunctiva/sclera: Conjunctivae normal.  Neck:     Comments: No thyromegaly or nodules palpated, patient has sensation over area of thyroid No neck swelling or erythema, full active ROM Cardiovascular:     Rate and Rhythm: Normal rate.  Pulmonary:     Effort: Pulmonary effort is normal. No respiratory distress.     Comments: Breathing comfortably at rest, CTABL, no wheezing, rales or other  adventitious sounds auscultated  Abdominal:     General: There is no distension.     Comments: Soft, nondistended, nontender to light and deep palpation  Musculoskeletal:        General: Normal range of motion.  Cervical back: Neck supple.  Skin:    General: Skin is warm and dry.  Neurological:     Mental Status: He is alert and oriented to person, place, and time.      UC Treatments / Results  Labs (all labs ordered are listed, but only abnormal results are displayed) Labs Reviewed  CBC WITH DIFFERENTIAL/PLATELET  TSH    EKG   Radiology No results found.  Procedures Procedures (including critical care time)  Medications Ordered in UC Medications - No data to display  Initial Impression / Assessment and Plan / UC Course  I have reviewed the triage vital signs and the nursing notes.  Pertinent labs & imaging results that were available during my care of the patient were reviewed by me and considered in my medical decision making (see chart for details).     Exam unremarkable.  Discussed concerns of various etiologies including stricture versus GERD versus esophageal motility disorder versus underlying thyroid problems.  Will check CBC and TSH today, sending referral to gastroenterology as patient likely would benefit from EGD versus barium swallow for further evaluation of dysphagia/globus pharyngeal sensation.  No airway compromise, patient stable.  Initiating on omeprazole twice daily x2 weeks for trial of PPI.  Discussed strict return precautions. Patient verbalized understanding and is agreeable with plan.  Final Clinical Impressions(s) / UC Diagnoses   Final diagnoses:  Dysphagia, unspecified type     Discharge Instructions     I am checking blood work to evaluate your thyroid-if level is abnormal please follow-up with your primary care for further evaluation/treatment of this I sent over a referral to gastroenterology to have further evaluation of your  throat discomfort-they will call you with appointment date/time In the meantime please begin taking omeprazole twice daily for the next 2 weeks Continue to monitor symptoms, if symptoms significantly worsening, developing difficulty breathing, throat swelling please go to the emergency room immediately   ED Prescriptions    Medication Sig Dispense Auth. Provider   omeprazole (PRILOSEC) 20 MG capsule Take 1 capsule (20 mg total) by mouth 2 (two) times daily before a meal for 14 days. 28 capsule , Montier C, PA-C     PDMP not reviewed this encounter.   Joneen Caraway Tullos C, PA-C 03/07/20 1233

## 2020-03-07 NOTE — ED Triage Notes (Signed)
Pt is here with the feeling of something in his throat, states its hard to swallow or even just not eating theres something in his airway. States he was admitted in [place] March 2021 with the same issue. Pt has a PCP & this developed months ago.

## 2020-03-07 NOTE — Discharge Instructions (Signed)
I am checking blood work to evaluate your thyroid-if level is abnormal please follow-up with your primary care for further evaluation/treatment of this I sent over a referral to gastroenterology to have further evaluation of your throat discomfort-they will call you with appointment date/time In the meantime please begin taking omeprazole twice daily for the next 2 weeks Continue to monitor symptoms, if symptoms significantly worsening, developing difficulty breathing, throat swelling please go to the emergency room immediately

## 2020-03-14 ENCOUNTER — Other Ambulatory Visit: Payer: Self-pay

## 2020-03-14 ENCOUNTER — Emergency Department (HOSPITAL_COMMUNITY): Payer: Medicare HMO

## 2020-03-14 ENCOUNTER — Emergency Department (HOSPITAL_COMMUNITY)
Admission: EM | Admit: 2020-03-14 | Discharge: 2020-03-14 | Disposition: A | Discharge status: Home / Self Care | Payer: Medicare HMO | Attending: Emergency Medicine | Admitting: Emergency Medicine

## 2020-03-14 DIAGNOSIS — R0989 Other specified symptoms and signs involving the circulatory and respiratory systems: Secondary | ICD-10-CM | POA: Diagnosis not present

## 2020-03-14 DIAGNOSIS — E119 Type 2 diabetes mellitus without complications: Secondary | ICD-10-CM | POA: Diagnosis not present

## 2020-03-14 DIAGNOSIS — R221 Localized swelling, mass and lump, neck: Secondary | ICD-10-CM | POA: Insufficient documentation

## 2020-03-14 DIAGNOSIS — Z760 Encounter for issue of repeat prescription: Secondary | ICD-10-CM | POA: Insufficient documentation

## 2020-03-14 DIAGNOSIS — Z7984 Long term (current) use of oral hypoglycemic drugs: Secondary | ICD-10-CM | POA: Diagnosis not present

## 2020-03-14 DIAGNOSIS — R4702 Dysphasia: Secondary | ICD-10-CM

## 2020-03-14 LAB — I-STAT CHEM 8, ED
BUN: 18 mg/dL (ref 8–23)
Calcium, Ion: 1.26 mmol/L (ref 1.15–1.40)
Chloride: 104 mmol/L (ref 98–111)
Creatinine, Ser: 1.1 mg/dL (ref 0.61–1.24)
Glucose, Bld: 110 mg/dL — ABNORMAL HIGH (ref 70–99)
HCT: 46 % (ref 39.0–52.0)
Hemoglobin: 15.6 g/dL (ref 13.0–17.0)
Potassium: 4.1 mmol/L (ref 3.5–5.1)
Sodium: 141 mmol/L (ref 135–145)
TCO2: 27 mmol/L (ref 22–32)

## 2020-03-14 MED ORDER — MAGIC MOUTHWASH
5.0000 mL | Freq: Once | ORAL | Status: DC
  Filled 2020-03-14: qty 5

## 2020-03-14 MED ORDER — IOHEXOL 300 MG/ML  SOLN
100.0000 mL | Freq: Once | INTRAMUSCULAR | Status: AC | PRN
  Administered 2020-03-14: 100 mL via INTRAVENOUS

## 2020-03-14 MED ORDER — NYSTATIN 100000 UNIT/ML MT SUSP
5.0000 mL | Freq: Four times a day (QID) | OROMUCOSAL | 0 refills | Status: DC
Start: 2020-03-14 — End: 2021-07-31

## 2020-03-14 MED ORDER — NIFEDIPINE ER OSMOTIC RELEASE 30 MG PO TB24
30.0000 mg | ORAL_TABLET | Freq: Every day | ORAL | 0 refills | Status: DC
Start: 2020-03-14 — End: 2021-07-31

## 2020-03-14 MED ORDER — ROSUVASTATIN CALCIUM 10 MG PO TABS: 10.0000 mg | ORAL_TABLET | Freq: Every day | ORAL | 0 refills | Status: AC

## 2020-03-14 NOTE — ED Notes (Signed)
Patient given discharge instructions. Questions were answered. Patient verbalized understanding of discharge instructions and care at home.  

## 2020-03-14 NOTE — ED Triage Notes (Signed)
Pt stated he noticed a lump on his neck; endorsed for the last couple of months he feels something in his throat when he swallows as if something is stuck but no difficulty or pain.

## 2020-03-14 NOTE — Discharge Instructions (Addendum)
Use the nystatin solution as directed.   Continue to take the medication you were given at urgent care.   Please keep your appointment with the gastroenterology doctor later this month.   You were also given information to follow-up with an ear nose and throat doctor.  Please call the office to schedule an appointment for follow-up for further evaluation.  Please also keep your appointment with your regular doctor later this month.

## 2020-03-14 NOTE — ED Provider Notes (Signed)
[facility] EMERGENCY DEPARTMENT Provider Note   CSN: [id number] Arrival date & time: 03/14/20  1005     History Chief Complaint  Patient presents with  . LUMP ON NECK    Isaac Contreras is a 64 y.o. male.  HPI   Patient is a 64 year old male with a history of MI, asthma, diabetes, hyperlipidemia, hypertension, tobacco abuse, who presents the emergency department today for evaluation of foreign body sensation in his throat and a swollen area to the lower part of his neck/sternum.  Patient notes that for the last month he has had a foreign body sensation in his throat when he swallows.  States it feels like he is getting something caught in his throat.  He has been able to eat and drink normally.  He does not have any specific pain.  He was seen at urgent care about a month ago and started on omeprazole which he states he has been compliant with.  His symptoms have not improved.  He was referred to GI and does have an appointment coming up later in the month.  Additionally, patient is concerned because he noticed a swollen area to the right upper sternum.  The area is nontender.  There are no color changes to the skin.  He has not had any injuries.  He has not had any fevers.  Past Medical History:  Diagnosis Date  . Acute MI ([place]) 1994   reports prior MI in [place]  . Arthritis   . Asthma   . Bronchitis   . Diabetes ([place])    diet controlled   . Diabetes mellitus without complication ([place])   . HLD (hyperlipidemia)   . Hx of adenomatous colonic polyps 02/22/2016  . Hypertension   . Tobacco abuse     Patient Active Problem List   Diagnosis Date Noted  . Hx of adenomatous colonic polyps 02/22/2016  . Chest pain 02/05/2014  . Dyspnea 02/05/2014  . Hypertension   . Diabetes mellitus without complication ([place])   . Acute MI ([place])   . Bronchitis     Past Surgical History:  Procedure Laterality Date  . APPENDECTOMY    . COLONOSCOPY  09/07/2008   [place], [place] tics,  hems  . cyst removed off back         Family History  Problem Relation Age of Onset  . Cancer Mother   . Colon cancer Neg Hx     Social History   Tobacco Use  . Smoking status: Former Smoker    Packs/day: 0.50    Years: 15.00    Pack years: 7.50    Types: Cigarettes    Quit date: 11/27/2015    Years since quitting: 4.2  . Smokeless tobacco: Never Used  Substance Use Topics  . Alcohol use: No    Alcohol/week: 0.0 standard drinks    Comment: occasionall beer  . Drug use: No    Home Medications Prior to Admission medications   Medication Sig Start Date End Date Taking? Authorizing Provider  albuterol (PROVENTIL HFA;VENTOLIN HFA) 108 (90 Base) MCG/ACT inhaler Inhale 2 puffs into the lungs every 6 (six) hours as needed for wheezing. 11/21/15  Yes Jaynee Eagles, PA-C  amLODipine (NORVASC) 10 MG tablet Take 10 mg by mouth daily.   Yes [provider]  aspirin 81 MG tablet Take 81 mg by mouth daily.   Yes [provider]  hydrochlorothiazide (MICROZIDE) 12.5 MG capsule Take 1 capsule (12.5 mg total) by mouth daily. 11/07/15  Yes Jaynee Eagles, PA-C  omeprazole (PRILOSEC) 20 MG capsule Take 1 capsule (20 mg total) by mouth 2 (two) times daily before a meal for 14 days. 03/07/20 03/21/20 Yes Wieters, Hallie C, PA-C  glucose blood (ACCU-CHEK AVIVA) test strip Test glucose with fingerstick once daily.  Diag: E11.9 05/21/16   Jaynee Eagles, PA-C  isosorbide mononitrate (IMDUR) 30 MG 24 hr tablet Take 30 mg by mouth daily. 12/28/19   [provider]  Lancets (ACCU-CHEK SAFE-T PRO) lancets Test glucose with fingerstick once daily. Diag: E11.9 05/21/16   Jaynee Eagles, PA-C  Lancets MISC Check blood sugar three times daily. 01/16/16   Jaynee Eagles, PA-C  NIFEdipine (PROCARDIA-XL/NIFEDICAL-XL) 30 MG 24 hr tablet Take 1 tablet (30 mg total) by mouth daily. 03/14/20   Keyla Milone S, PA-C  nystatin (MYCOSTATIN) 100000 UNIT/ML suspension Take 5 mLs (500,000 Units total) by mouth 4  (four) times daily. 03/14/20   Benford Asch S, PA-C  rosuvastatin (CRESTOR) 10 MG tablet Take 1 tablet (10 mg total) by mouth daily. 03/14/20   Valleri Hendricksen S, PA-C    Allergies    Patient has no known allergies.  Review of Systems   Review of Systems  Constitutional: Negative for fever.  HENT: Positive for trouble swallowing. Negative for voice change.        Lump to neck/chest  Eyes: Negative for visual disturbance.  Respiratory: Negative for shortness of breath.   Cardiovascular: Negative for chest pain.  Gastrointestinal: Negative for abdominal pain, constipation, diarrhea, nausea and vomiting.  Genitourinary: Negative for dysuria and hematuria.  Musculoskeletal: Negative for back pain.  Skin: Negative for color change and rash.  Neurological: Negative for headaches.  All other systems reviewed and are negative.   Physical Exam Updated Vital Signs BP (!) 153/98 (BP Location: Right Arm)   Pulse (!) 107   Temp 98.9 F (37.2 C) (Oral)   Resp 16   Ht 5\' 9"  (1.753 m)   Wt 100.7 kg   SpO2 100%   BMI 32.78 kg/m   Physical Exam Vitals and nursing note reviewed.  Constitutional:      Appearance: He is well-developed.  HENT:     Head: Normocephalic and atraumatic.     Mouth/Throat:     Pharynx: No oropharyngeal exudate or posterior oropharyngeal erythema.     Comments: White patches to the tongue noted Eyes:     Conjunctiva/sclera: Conjunctivae normal.  Cardiovascular:     Rate and Rhythm: Normal rate and regular rhythm.     Heart sounds: Normal heart sounds. No murmur.  Pulmonary:     Effort: Pulmonary effort is normal. No respiratory distress.     Breath sounds: Normal breath sounds. No wheezing, rhonchi or rales.  Abdominal:     General: Bowel sounds are normal.     Palpations: Abdomen is soft.     Tenderness: There is no abdominal tenderness. There is no guarding or rebound.  Musculoskeletal:     Cervical back: Neck supple.  Lymphadenopathy:     Comments:  Possible lymphadenopathy noted to the right upper sternum. No erythema, induration. Tissue is mobile an nontender.   Skin:    General: Skin is warm and dry.  Neurological:     Mental Status: He is alert.  Psychiatric:     Comments: anxious     ED Results / Procedures / Treatments   Labs (all labs ordered are listed, but only abnormal results are displayed) Labs Reviewed  I-STAT CHEM 8, ED - Abnormal;  Notable for the following components:      Result Value   Glucose, Bld 110 (*)    All other components within normal limits    EKG None  Radiology CT Soft Tissue Neck W Contrast  Result Date: 03/14/2020 CLINICAL DATA:  Lymph node swelling, dysphagia. EXAM: CT NECK WITH CONTRAST TECHNIQUE: Multidetector CT imaging of the neck was performed using the standard protocol following the bolus administration of intravenous contrast. CONTRAST:  13mL OMNIPAQUE IOHEXOL 300 MG/ML  SOLN COMPARISON:  None. FINDINGS: Pharynx and larynx: Soft palate is prominent and appears enlarged. Soft palate appears to be touching the posterior nasopharynx and difficult to separate the 2. No pharyngeal abscess. Epiglottis and vocal cords normal. Salivary glands: No inflammation, mass, or stone. Thyroid: Negative Lymph nodes: No enlarged lymph nodes in the neck. Vascular: Normal vascular enhancement Limited intracranial: Negative Visualized orbits: Negative Mastoids and visualized paranasal sinuses: Mild mucosal edema paranasal sinuses. Skeleton: Lucency in the right anterior maxilla appears to be related to prior tooth extraction. This has irregular margins. Possible chronic infection in the bone. Upper chest: Negative Other: None IMPRESSION: Prominent soft palate which is touching the posterior nasopharynx. It is difficult to separate the 2 structures. This could be related to Valsalva. Possible inflammation of the soft palate. Tumor cannot be excluded and direct visualization may be indicated. No adenopathy in the neck  Extraction site for right upper bicuspid with persistent lucency in the bone raising the possibility of chronic infection. Electronically Signed   By: Franchot Gallo M.D.   On: 03/14/2020 13:26    Procedures Procedures (including critical care time)  Medications Ordered in ED Medications  magic mouthwash (5 mLs Oral Not Given 03/14/20 1435)  iohexol (OMNIPAQUE) 300 MG/ML solution 100 mL (100 mLs Intravenous Contrast Given 03/14/20 1303)    ED Course  I have reviewed the triage vital signs and the nursing notes.  Pertinent labs & imaging results that were available during my care of the patient were reviewed by me and considered in my medical decision making (see chart for details).    MDM Rules/Calculators/A&P                     64 year old male presenting for evaluation of dysphagia and foreign body sensation in the throat for the last month.  Also reports some swelling to the right sternoclavicular area.  No concerning findings on exam.  Chem-8 with normal renal function  CT soft tissue neck with prominent soft palate which is touching the posterior nasopharynx. It is difficult to separate the 2 structures. This could be related to Valsalva. Possible inflammation of the soft palate. Tumor cannot be excluded and direct visualization may be indicated.    - will consult ENT  Discussed case with Dr. Constance Holster with ear nose and throat, he states the patient can follow-up in the office.  He advised that the patient call the office schedule an appointment for follow-up.  Reassessed patient.  Discussed the results of CT imaging and plan for follow-up with ear nose and throat.  I will also give him nystatin suspension for his thrush.  Advised him to continue his PPI, keep his appointment with his GI doc and also with his PCP.  Advised to follow-up with ENT.  Advised on strict return precautions.  He is also requesting a refill of his blood pressure and cholesterol medications.  Refill given.  All  questions were answered.  Patient stable for discharge.  Case discussed with Dr.  Zackowski who is in agreement with the plan.   Final Clinical Impression(s) / ED Diagnoses Final diagnoses:  Dysphasia  Medication refill    Rx / DC Orders ED Discharge Orders         Ordered    nystatin (MYCOSTATIN) 100000 UNIT/ML suspension  4 times daily     03/14/20 1432    NIFEdipine (PROCARDIA-XL/NIFEDICAL-XL) 30 MG 24 hr tablet  Daily     03/14/20 1438    rosuvastatin (CRESTOR) 10 MG tablet  Daily     03/14/20 1438           Israel Wunder S, PA-C 03/14/20 1441    Fredia Sorrow, MD 03/16/20 [phone]

## 2020-03-26 NOTE — Progress Notes (Deleted)
03/26/2020 Isaac Contreras [id number] 07-14-1956   CHIEF COMPLAINT:   HISTORY OF PRESENT ILLNESS:  Isaac Contreras is a 63 year old male with a past medical history of arthritis, asthma, hypertension, coronary artery disease with MI in 1994 and colon polyps.   He developed ~sensation in his throat approximately 6 weeks ago. He was seen in urgent care 5/6 and he was prescribed omeprazole 20 mg po bid and he was referred to our office for further GI evaluation.  He presented to [facility] ED on 03/14/2020 with complaints of a foreign body sensation in his throat and swollen area to the lower part of his neck/sternum. A CT soft tissue neck with prominent soft palate which is touching the posterior nasopharynx. A tumor could not be excluded. He was referred to ENT for further evaluation. He  assessed to have oral thrush and was prescribed nystatin.  CT soft tissue neck 03/14/2020: Prominent soft palate which is touching the posterior nasopharynx. It is difficult to separate the 2 structures. This could be related to Valsalva. Possible inflammation of the soft palate. Tumor cannot be excluded and direct visualization may be indicated.  No adenopathy in the neck  Extraction site for right upper bicuspid with persistent lucency in the bone raising the possibility of chronic infection.  Colonoscopy 02/12/2016 by Dr. Carlean Contreras: - Two 2 to 4 mm tubular adenomatous polyps in the ascending colon, removed with a cold snare. Resected and retrieved. - Mild diverticulosis in the entire examined colon. There was no evidence of diverticular bleeding. - The examination was otherwise normal on direct and retroflexion views. - Recall colonoscopy 01/2021  CBC Latest Ref Rng & Units 03/14/2020 03/07/2020 11/07/2015  WBC 4.0 - 10.5 K/uL - 9.4 10.2  Hemoglobin 13.0 - 17.0 g/dL 15.6 13.9 14.8  Hematocrit 39.0 - 52.0 % 46.0 42.1 42.7  Platelets 150 - 400 K/uL - 365 416(H)   CMP Latest Ref Rng & Units  03/14/2020 11/07/2015 06/26/2015  Glucose 70 - 99 mg/dL 110(H) 88 98  BUN 8 - 23 mg/dL 18 16 17   Creatinine 0.61 - 1.24 mg/dL 1.10 1.02 1.18  Sodium 135 - 145 mmol/L 141 137 141  Potassium 3.5 - 5.1 mmol/L 4.1 4.4 4.8  Chloride 98 - 111 mmol/L 104 108 107  CO2 20 - 31 mmol/L - 21 26  Calcium 8.6 - 10.3 mg/dL - 9.8 10.0  Total Protein 6.1 - 8.1 g/dL - 7.0 -  Total Bilirubin 0.2 - 1.2 mg/dL - 0.4 -  Alkaline Phos 40 - 115 U/L - 44 -  AST 10 - 35 U/L - 24 -  ALT 9 - 46 U/L - 40 -       Past Medical History:  Diagnosis Date  . Acute MI ([place]) 1994   reports prior MI in [place]  . Arthritis   . Asthma   . Bronchitis   . Diabetes ([place])    diet controlled   . Diabetes mellitus without complication ([place])   . HLD (hyperlipidemia)   . Hx of adenomatous colonic polyps 02/22/2016  . Hypertension   . Tobacco abuse    Past Surgical History:  Procedure Laterality Date  . APPENDECTOMY    . COLONOSCOPY  09/07/2008   [place], [place] tics, hems  . cyst removed off back      Social History:  Family History:    reports that he quit smoking about 4 years ago. His smoking use included cigarettes. He has a 7.50 pack-year  smoking history. He has never used smokeless tobacco. He reports that he does not drink alcohol or use drugs. family history includes Cancer in his mother. No Known Allergies    Outpatient Encounter Medications as of 03/27/2020  Medication Sig  . albuterol (PROVENTIL HFA;VENTOLIN HFA) 108 (90 Base) MCG/ACT inhaler Inhale 2 puffs into the lungs every 6 (six) hours as needed for wheezing.  Marland Kitchen amLODipine (NORVASC) 10 MG tablet Take 10 mg by mouth daily.  Marland Kitchen aspirin 81 MG tablet Take 81 mg by mouth daily.  Marland Kitchen glucose blood (ACCU-CHEK AVIVA) test strip Test glucose with fingerstick once daily.  Diag: E11.9  . hydrochlorothiazide (MICROZIDE) 12.5 MG capsule Take 1 capsule (12.5 mg total) by mouth daily.  . isosorbide mononitrate (IMDUR) 30 MG 24 hr tablet Take 30 mg by mouth daily.  . Lancets  (ACCU-CHEK SAFE-T PRO) lancets Test glucose with fingerstick once daily. Diag: E11.9  . Lancets MISC Check blood sugar three times daily.  Marland Kitchen NIFEdipine (PROCARDIA-XL/NIFEDICAL-XL) 30 MG 24 hr tablet Take 1 tablet (30 mg total) by mouth daily.  Marland Kitchen nystatin (MYCOSTATIN) 100000 UNIT/ML suspension Take 5 mLs (500,000 Units total) by mouth 4 (four) times daily.  Marland Kitchen omeprazole (PRILOSEC) 20 MG capsule Take 1 capsule (20 mg total) by mouth 2 (two) times daily before a meal for 14 days.  . rosuvastatin (CRESTOR) 10 MG tablet Take 1 tablet (10 mg total) by mouth daily.   No facility-administered encounter medications on file as of 03/27/2020.     REVIEW OF SYSTEMS: All other systems reviewed and negative except where noted in the History of Present Illness.   PHYSICAL EXAM: There were no vitals taken for this visit. General: Well developed ... in no acute distress. Head: Normocephalic and atraumatic. Eyes:  Sclerae non-icteric, conjunctive pink. Ears: Normal auditory acuity. Mouth: Dentition intact. No ulcers or lesions.  Neck: Supple, no lymphadenopathy or thyromegaly.  Lungs: Clear bilaterally to auscultation without wheezes, crackles or rhonchi. Heart: Regular rate and rhythm. No murmur, rub or gallop appreciated.  Abdomen: Soft, nontender, non distended. No masses. No hepatosplenomegaly. Normoactive bowel sounds x 4 quadrants.  Rectal:  Musculoskeletal: Symmetrical with no gross deformities. Skin: Warm and dry. No rash or lesions on visible extremities. Extremities: No edema. Neurological: Alert oriented x 4, no focal deficits.  Psychological:  Alert and cooperative. Normal mood and affect.  ASSESSMENT AND PLAN:  52. 64 year old male with dysphagia. Evaluation in the ED identified oral candidiasis. His dysphagia may be due to possible esophageal candidiasis. -EGD to rule out esophageal candidiasis, reflux esophagitis, esophageal stricture and upper GI malignancy  2. History of tubular  adenomatous colon polyps  3. History of CAD, MI   CC:  Isaac Contreras, Isaac Contreras

## 2020-03-27 ENCOUNTER — Ambulatory Visit: Payer: Medicare HMO | Admitting: Nurse Practitioner

## 2020-04-19 ENCOUNTER — Encounter (HOSPITAL_COMMUNITY)
Admission: EM | Disposition: A | Discharge status: Home / Self Care | Payer: Self-pay | Source: Home / Self Care | Attending: Emergency Medicine

## 2020-04-19 ENCOUNTER — Ambulatory Visit (HOSPITAL_COMMUNITY)
Admission: EM | Admit: 2020-04-19 | Discharge: 2020-04-19 | Disposition: A | Discharge status: Home / Self Care | Payer: Medicare HMO | Attending: Orthopedic Surgery | Admitting: Orthopedic Surgery

## 2020-04-19 ENCOUNTER — Encounter (HOSPITAL_COMMUNITY): Payer: Self-pay

## 2020-04-19 ENCOUNTER — Emergency Department (HOSPITAL_COMMUNITY): Payer: Medicare HMO | Admitting: Anesthesiology

## 2020-04-19 ENCOUNTER — Emergency Department (HOSPITAL_COMMUNITY): Payer: Medicare HMO

## 2020-04-19 ENCOUNTER — Other Ambulatory Visit: Payer: Self-pay

## 2020-04-19 DIAGNOSIS — M1990 Unspecified osteoarthritis, unspecified site: Secondary | ICD-10-CM | POA: Insufficient documentation

## 2020-04-19 DIAGNOSIS — Z87891 Personal history of nicotine dependence: Secondary | ICD-10-CM | POA: Diagnosis not present

## 2020-04-19 DIAGNOSIS — I252 Old myocardial infarction: Secondary | ICD-10-CM | POA: Insufficient documentation

## 2020-04-19 DIAGNOSIS — Z7982 Long term (current) use of aspirin: Secondary | ICD-10-CM | POA: Diagnosis not present

## 2020-04-19 DIAGNOSIS — E119 Type 2 diabetes mellitus without complications: Secondary | ICD-10-CM | POA: Diagnosis not present

## 2020-04-19 DIAGNOSIS — Z20822 Contact with and (suspected) exposure to covid-19: Secondary | ICD-10-CM | POA: Insufficient documentation

## 2020-04-19 DIAGNOSIS — E785 Hyperlipidemia, unspecified: Secondary | ICD-10-CM | POA: Insufficient documentation

## 2020-04-19 DIAGNOSIS — I2510 Atherosclerotic heart disease of native coronary artery without angina pectoris: Secondary | ICD-10-CM | POA: Diagnosis not present

## 2020-04-19 DIAGNOSIS — J45909 Unspecified asthma, uncomplicated: Secondary | ICD-10-CM | POA: Diagnosis not present

## 2020-04-19 DIAGNOSIS — I10 Essential (primary) hypertension: Secondary | ICD-10-CM | POA: Insufficient documentation

## 2020-04-19 DIAGNOSIS — S68120A Partial traumatic metacarpophalangeal amputation of right index finger, initial encounter: Secondary | ICD-10-CM | POA: Diagnosis not present

## 2020-04-19 DIAGNOSIS — W293XXA Contact with powered garden and outdoor hand tools and machinery, initial encounter: Secondary | ICD-10-CM | POA: Insufficient documentation

## 2020-04-19 DIAGNOSIS — Z79899 Other long term (current) drug therapy: Secondary | ICD-10-CM | POA: Insufficient documentation

## 2020-04-19 HISTORY — PX: I & D EXTREMITY: SHX5045

## 2020-04-19 LAB — CBC WITH DIFFERENTIAL/PLATELET
Abs Immature Granulocytes: 0.05 10*3/uL (ref 0.00–0.07)
Basophils Absolute: 0.1 10*3/uL (ref 0.0–0.1)
Basophils Relative: 1 %
Eosinophils Absolute: 0.6 10*3/uL — ABNORMAL HIGH (ref 0.0–0.5)
Eosinophils Relative: 6 %
HCT: 38 % — ABNORMAL LOW (ref 39.0–52.0)
Hemoglobin: 12.2 g/dL — ABNORMAL LOW (ref 13.0–17.0)
Immature Granulocytes: 1 %
Lymphocytes Relative: 27 %
Lymphs Abs: 2.8 10*3/uL (ref 0.7–4.0)
MCH: 28.8 pg (ref 26.0–34.0)
MCHC: 32.1 g/dL (ref 30.0–36.0)
MCV: 89.6 fL (ref 80.0–100.0)
Monocytes Absolute: 0.9 10*3/uL (ref 0.1–1.0)
Monocytes Relative: 9 %
Neutro Abs: 6 10*3/uL (ref 1.7–7.7)
Neutrophils Relative %: 56 %
Platelets: 290 10*3/uL (ref 150–400)
RBC: 4.24 MIL/uL (ref 4.22–5.81)
RDW: 15 % (ref 11.5–15.5)
WBC: 10.5 10*3/uL (ref 4.0–10.5)
nRBC: 0 % (ref 0.0–0.2)

## 2020-04-19 LAB — BASIC METABOLIC PANEL
Anion gap: 8 (ref 5–15)
BUN: 17 mg/dL (ref 8–23)
CO2: 24 mmol/L (ref 22–32)
Calcium: 8.8 mg/dL — ABNORMAL LOW (ref 8.9–10.3)
Chloride: 109 mmol/L (ref 98–111)
Creatinine, Ser: 1.45 mg/dL — ABNORMAL HIGH (ref 0.61–1.24)
GFR calc Af Amer: 59 mL/min — ABNORMAL LOW (ref >60)
GFR calc non Af Amer: 51 mL/min — ABNORMAL LOW (ref >60)
Glucose, Bld: 94 mg/dL (ref 70–99)
Potassium: 4.5 mmol/L (ref 3.5–5.1)
Sodium: 141 mmol/L (ref 135–145)

## 2020-04-19 LAB — SARS CORONAVIRUS 2 BY RT PCR (HOSPITAL ORDER, PERFORMED IN ~~LOC~~ HOSPITAL LAB): SARS Coronavirus 2: NEGATIVE

## 2020-04-19 SURGERY — IRRIGATION AND DEBRIDEMENT EXTREMITY
Anesthesia: General | Laterality: Right

## 2020-04-19 MED ORDER — LABETALOL HCL 5 MG/ML IV SOLN
10.0000 mg | Freq: Once | INTRAVENOUS | Status: AC
  Administered 2020-04-19: 10 mg via INTRAVENOUS

## 2020-04-19 MED ORDER — CEFAZOLIN SODIUM-DEXTROSE 1-4 GM/50ML-% IV SOLN
1.0000 g | Freq: Once | INTRAVENOUS | Status: AC
  Administered 2020-04-19: 1 g via INTRAVENOUS
  Filled 2020-04-19: qty 50

## 2020-04-19 MED ORDER — BUPIVACAINE HCL (PF) 0.5 % IJ SOLN
10.0000 mL | Freq: Once | INTRAMUSCULAR | Status: AC
  Administered 2020-04-19: 10 mL
  Filled 2020-04-19: qty 10

## 2020-04-19 MED ORDER — CHLORHEXIDINE GLUCONATE 4 % EX LIQD
60.0000 mL | Freq: Once | CUTANEOUS | Status: DC
  Filled 2020-04-19: qty 60

## 2020-04-19 MED ORDER — MIDAZOLAM HCL 5 MG/5ML IJ SOLN
INTRAMUSCULAR | Status: DC | PRN
Start: 2020-04-19 — End: 2020-04-19
  Administered 2020-04-19: 2 mg via INTRAVENOUS

## 2020-04-19 MED ORDER — CEFAZOLIN SODIUM-DEXTROSE 2-4 GM/100ML-% IV SOLN
2.0000 g | INTRAVENOUS | Status: DC
  Filled 2020-04-19: qty 100

## 2020-04-19 MED ORDER — MIDAZOLAM HCL 2 MG/2ML IJ SOLN: 0.5000 mg | Freq: Once | INTRAMUSCULAR | Status: DC | PRN

## 2020-04-19 MED ORDER — FENTANYL CITRATE (PF) 250 MCG/5ML IJ SOLN
INTRAMUSCULAR | Status: DC | PRN
  Administered 2020-04-19: 100 ug via INTRAVENOUS
  Administered 2020-04-19 (×3): 50 ug via INTRAVENOUS

## 2020-04-19 MED ORDER — OXYCODONE HCL 5 MG/5ML PO SOLN: 5.0000 mg | Freq: Once | ORAL | Status: DC | PRN

## 2020-04-19 MED ORDER — PROPOFOL 10 MG/ML IV BOLUS
INTRAVENOUS | Status: DC | PRN
  Administered 2020-04-19: 200 mg via INTRAVENOUS

## 2020-04-19 MED ORDER — METHOCARBAMOL 500 MG PO TABS: 500.0000 mg | ORAL_TABLET | Freq: Four times a day (QID) | ORAL | 0 refills | Status: DC

## 2020-04-19 MED ORDER — LACTATED RINGERS IV SOLN: INTRAVENOUS | Status: DC

## 2020-04-19 MED ORDER — CHLORHEXIDINE GLUCONATE 0.12 % MT SOLN
15.0000 mL | Freq: Once | OROMUCOSAL | Status: AC
  Administered 2020-04-19: 15 mL via OROMUCOSAL
  Filled 2020-04-19 (×2): qty 15

## 2020-04-19 MED ORDER — LIDOCAINE HCL (CARDIAC) PF 100 MG/5ML IV SOSY
PREFILLED_SYRINGE | INTRAVENOUS | Status: DC | PRN
Start: 2020-04-19 — End: 2020-04-19
  Administered 2020-04-19: 60 mg via INTRATRACHEAL

## 2020-04-19 MED ORDER — BUPIVACAINE HCL (PF) 0.5 % IJ SOLN
INTRAMUSCULAR | Status: AC
  Filled 2020-04-19: qty 30

## 2020-04-19 MED ORDER — SODIUM CHLORIDE 0.9 % IR SOLN
Status: DC | PRN
  Administered 2020-04-19: 3000 mL

## 2020-04-19 MED ORDER — ONDANSETRON HCL 4 MG/2ML IJ SOLN
INTRAMUSCULAR | Status: DC | PRN
Start: 2020-04-19 — End: 2020-04-19
  Administered 2020-04-19: 4 mg via INTRAVENOUS

## 2020-04-19 MED ORDER — PROMETHAZINE HCL 25 MG/ML IJ SOLN: 6.2500 mg | INTRAMUSCULAR | Status: DC | PRN

## 2020-04-19 MED ORDER — MEPERIDINE HCL 25 MG/ML IJ SOLN: 6.2500 mg | INTRAMUSCULAR | Status: DC | PRN

## 2020-04-19 MED ORDER — HYDROMORPHONE HCL 1 MG/ML IJ SOLN: 0.2500 mg | INTRAMUSCULAR | Status: DC | PRN

## 2020-04-19 MED ORDER — CEFAZOLIN SODIUM-DEXTROSE 2-3 GM-%(50ML) IV SOLR
INTRAVENOUS | Status: DC | PRN
Start: 2020-04-19 — End: 2020-04-19
  Administered 2020-04-19: 2 g via INTRAVENOUS

## 2020-04-19 MED ORDER — CEPHALEXIN 500 MG PO CAPS: 500.0000 mg | ORAL_CAPSULE | Freq: Four times a day (QID) | ORAL | 0 refills | Status: AC

## 2020-04-19 MED ORDER — MIDAZOLAM HCL 2 MG/2ML IJ SOLN
INTRAMUSCULAR | Status: AC
  Filled 2020-04-19: qty 2

## 2020-04-19 MED ORDER — BUPIVACAINE HCL (PF) 0.5 % IJ SOLN
INTRAMUSCULAR | Status: DC | PRN
  Administered 2020-04-19: 10 mL

## 2020-04-19 MED ORDER — LABETALOL HCL 5 MG/ML IV SOLN
INTRAVENOUS | Status: AC
  Filled 2020-04-19: qty 4

## 2020-04-19 MED ORDER — LACTATED RINGERS IV SOLN
INTRAVENOUS | Status: DC | PRN
Start: 2020-04-19 — End: 2020-04-19

## 2020-04-19 MED ORDER — FENTANYL CITRATE (PF) 250 MCG/5ML IJ SOLN
INTRAMUSCULAR | Status: AC
  Filled 2020-04-19: qty 5

## 2020-04-19 MED ORDER — POVIDONE-IODINE 10 % EX SWAB: 2.0000 "application " | Freq: Once | CUTANEOUS | Status: DC

## 2020-04-19 MED ORDER — OXYCODONE HCL 5 MG PO TABS: 5.0000 mg | ORAL_TABLET | Freq: Once | ORAL | Status: DC | PRN

## 2020-04-19 MED ORDER — MORPHINE SULFATE (PF) 2 MG/ML IV SOLN
2.0000 mg | Freq: Once | INTRAVENOUS | Status: AC
  Administered 2020-04-19: 2 mg via INTRAVENOUS
  Filled 2020-04-19: qty 1

## 2020-04-19 MED ORDER — OXYCODONE HCL 5 MG PO TABS: 5.0000 mg | ORAL_TABLET | ORAL | 0 refills | Status: AC | PRN

## 2020-04-19 MED ORDER — SODIUM CHLORIDE 0.9 % IV BOLUS
1000.0000 mL | Freq: Once | INTRAVENOUS | Status: AC
  Administered 2020-04-19: 1000 mL via INTRAVENOUS

## 2020-04-19 MED ORDER — ORAL CARE MOUTH RINSE: 15.0000 mL | Freq: Once | OROMUCOSAL | Status: AC

## 2020-04-19 SURGICAL SUPPLY — 32 items
BNDG CONFORM 2 STRL LF (GAUZE/BANDAGES/DRESSINGS) ×2 IMPLANT
BNDG CONFORM 3 STRL LF (GAUZE/BANDAGES/DRESSINGS) ×2 IMPLANT
CORD BIPOLAR FORCEPS 12FT (ELECTRODE) ×2 IMPLANT
COVER SURGICAL LIGHT HANDLE (MISCELLANEOUS) ×2 IMPLANT
CUFF TOURN SGL QUICK 18X4 (TOURNIQUET CUFF) ×2 IMPLANT
DRAPE C-ARM MINI (DRAPES) ×2 IMPLANT
DRSG EMULSION OIL 3X3 NADH (GAUZE/BANDAGES/DRESSINGS) ×2 IMPLANT
GAUZE SPONGE 4X4 12PLY STRL (GAUZE/BANDAGES/DRESSINGS) ×2 IMPLANT
GAUZE XEROFORM 1X8 LF (GAUZE/BANDAGES/DRESSINGS) ×2 IMPLANT
GLOVE BIOGEL M 8.0 STRL (GLOVE) ×2 IMPLANT
GLOVE SS BIOGEL STRL SZ 8 (GLOVE) ×1 IMPLANT
GLOVE SUPERSENSE BIOGEL SZ 8 (GLOVE) ×1
GOWN STRL REUS W/ TWL LRG LVL3 (GOWN DISPOSABLE) ×1 IMPLANT
GOWN STRL REUS W/ TWL XL LVL3 (GOWN DISPOSABLE) ×2 IMPLANT
GOWN STRL REUS W/TWL LRG LVL3 (GOWN DISPOSABLE) ×1
GOWN STRL REUS W/TWL XL LVL3 (GOWN DISPOSABLE) ×2
KIT BASIN OR (CUSTOM PROCEDURE TRAY) ×2 IMPLANT
KIT TURNOVER KIT B (KITS) ×2 IMPLANT
MANIFOLD NEPTUNE II (INSTRUMENTS) ×2 IMPLANT
NS IRRIG 1000ML POUR BTL (IV SOLUTION) ×2 IMPLANT
PACK ORTHO EXTREMITY (CUSTOM PROCEDURE TRAY) ×2 IMPLANT
PAD ARMBOARD 7.5X6 YLW CONV (MISCELLANEOUS) ×2 IMPLANT
PADDING CAST ABS 3INX4YD NS (CAST SUPPLIES) ×1
PADDING CAST ABS COTTON 3X4 (CAST SUPPLIES) ×1 IMPLANT
SET CYSTO W/LG BORE CLAMP LF (SET/KITS/TRAYS/PACK) ×2 IMPLANT
SLING ARM IMMOBILIZER XL (CAST SUPPLIES) ×2 IMPLANT
SOL PREP POV-IOD 4OZ 10% (MISCELLANEOUS) ×4 IMPLANT
SUT CHROMIC 5 0 P 3 (SUTURE) ×2 IMPLANT
SUT PROLENE 4 0 PS 2 18 (SUTURE) ×2 IMPLANT
TOWEL GREEN STERILE (TOWEL DISPOSABLE) ×2 IMPLANT
TOWEL GREEN STERILE FF (TOWEL DISPOSABLE) ×2 IMPLANT
TUBE CONNECTING 12X1/4 (SUCTIONS) ×2 IMPLANT

## 2020-04-19 NOTE — ED Provider Notes (Signed)
[facility] EMERGENCY DEPARTMENT Provider Note   CSN: [id number] Arrival date & time: 04/19/20  1345     History Chief Complaint  Patient presents with  . chainsaw vs. finger    Kenry Daubert is a 64 y.o. male.  64 y.o male with a PMH of MI, Asthma, DM presents to the ED via EMS status post partial amputation to the right index finger.  Patient reports being at work, using the chainsaw, when he suddenly noted that it cut the distal aspect of his right index finger.  Patient had applied pressure to the wound and this was dressed by EMS.  He reports slight numbness to the distal aspect of his right index finger.  Pain exacerbated with touch.  Last tetanus vaccine around November of last year.  Denies any other injury, has not taken any medication for pain control.  No other injuries.  The history is provided by the patient and medical records.       Past Medical History:  Diagnosis Date  . Acute MI ([place]) 1994   reports prior MI in [place]  . Arthritis   . Asthma   . Bronchitis   . Diabetes ([place])    diet controlled   . Diabetes mellitus without complication ([place])   . HLD (hyperlipidemia)   . Hx of adenomatous colonic polyps 02/22/2016  . Hypertension   . Tobacco abuse     Patient Active Problem List   Diagnosis Date Noted  . Hx of adenomatous colonic polyps 02/22/2016  . Chest pain 02/05/2014  . Dyspnea 02/05/2014  . Hypertension   . Diabetes mellitus without complication ([place])   . Acute MI ([place])   . Bronchitis     Past Surgical History:  Procedure Laterality Date  . APPENDECTOMY    . COLONOSCOPY  09/07/2008   [place], [place] tics, hems  . cyst removed off back         Family History  Problem Relation Age of Onset  . Cancer Mother   . Colon cancer Neg Hx     Social History   Tobacco Use  . Smoking status: Former Smoker    Packs/day: 0.50    Years: 15.00    Pack years: 7.50    Types: Cigarettes    Quit date: 11/27/2015    Years since  quitting: 4.3  . Smokeless tobacco: Never Used  Substance Use Topics  . Alcohol use: No    Alcohol/week: 0.0 standard drinks    Comment: occasionall beer  . Drug use: No    Home Medications Prior to Admission medications   Medication Sig Start Date End Date Taking? Authorizing Provider  albuterol (PROVENTIL HFA;VENTOLIN HFA) 108 (90 Base) MCG/ACT inhaler Inhale 2 puffs into the lungs every 6 (six) hours as needed for wheezing. 11/21/15   Jaynee Eagles, PA-C  amLODipine (NORVASC) 10 MG tablet Take 10 mg by mouth daily.    [provider]  aspirin 81 MG tablet Take 81 mg by mouth daily.    [provider]  glucose blood (ACCU-CHEK AVIVA) test strip Test glucose with fingerstick once daily.  Diag: E11.9 05/21/16   Jaynee Eagles, PA-C  hydrochlorothiazide (MICROZIDE) 12.5 MG capsule Take 1 capsule (12.5 mg total) by mouth daily. 11/07/15   Jaynee Eagles, PA-C  isosorbide mononitrate (IMDUR) 30 MG 24 hr tablet Take 30 mg by mouth daily. 12/28/19   [provider]  Lancets (ACCU-CHEK SAFE-T PRO) lancets Test glucose with fingerstick once daily. Diag: E11.9 05/21/16  Jaynee Eagles, PA-C  Lancets MISC Check blood sugar three times daily. 01/16/16   Jaynee Eagles, PA-C  NIFEdipine (PROCARDIA-XL/NIFEDICAL-XL) 30 MG 24 hr tablet Take 1 tablet (30 mg total) by mouth daily. 03/14/20   Couture, Cortni S, PA-C  nystatin (MYCOSTATIN) 100000 UNIT/ML suspension Take 5 mLs (500,000 Units total) by mouth 4 (four) times daily. 03/14/20   Couture, Cortni S, PA-C  omeprazole (PRILOSEC) 20 MG capsule Take 1 capsule (20 mg total) by mouth 2 (two) times daily before a meal for 14 days. 03/07/20 03/21/20  Wieters, Hallie C, PA-C  rosuvastatin (CRESTOR) 10 MG tablet Take 1 tablet (10 mg total) by mouth daily. 03/14/20   Couture, Cortni S, PA-C    Allergies    Patient has no known allergies.  Review of Systems   Review of Systems  Constitutional: Negative for fever.  Respiratory: Negative for shortness of  breath.   Cardiovascular: Negative for chest pain.  Gastrointestinal: Negative for abdominal pain.  Genitourinary: Negative for flank pain.  Musculoskeletal: Negative for back pain.  Skin: Positive for wound.  Neurological: Negative for light-headedness and headaches.  All other systems reviewed and are negative.   Physical Exam Updated Vital Signs BP (!) 165/79 (BP Location: Left Arm)   Pulse 83   Temp 98.6 F (37 C) (Oral)   Resp 18   SpO2 94%   Physical Exam Vitals and nursing note reviewed.  Constitutional:      Appearance: Normal appearance.     Comments: Actively crying on examination.   HENT:     Head: Normocephalic and atraumatic.     Mouth/Throat:     Mouth: Mucous membranes are moist.  Eyes:     Pupils: Pupils are equal, round, and reactive to light.  Cardiovascular:     Rate and Rhythm: Normal rate.  Pulmonary:     Effort: Pulmonary effort is normal.  Abdominal:     General: Abdomen is flat.     Tenderness: There is no abdominal tenderness.  Musculoskeletal:     Right hand: Deformity, laceration, tenderness and bony tenderness present. Decreased range of motion. Normal strength. Decreased sensation. Decreased capillary refill. Normal pulse.     Cervical back: Normal range of motion and neck supple.     Comments: Please see photos attached.   Skin:    General: Skin is warm and dry.  Neurological:     Mental Status: He is alert and oriented to person, place, and time.         ED Results / Procedures / Treatments   Labs (all labs ordered are listed, but only abnormal results are displayed) Labs Reviewed  SARS CORONAVIRUS 2 BY RT PCR ([place], [facility])  CBC WITH DIFFERENTIAL/PLATELET  BASIC METABOLIC PANEL    EKG None  Radiology DG Finger Index Right  Result Date: 04/19/2020 CLINICAL DATA:  Chainsaw injury EXAM: RIGHT INDEX FINGER 2+V COMPARISON:  None FINDINGS: Extensive soft tissue laceration along the  radial aspect of the second digit at the level of the distal interphalangeal joint with associated osseous defect of the head of the second middle phalanx and a comminuted fracture of the base of the first distal phalanx with involvement of the distal interphalangeal joint itself. Several tiny ossific fragments are contained within the soft tissues. Overlying bandaging material is present. No other acute or suspicious osseous abnormalities. IMPRESSION: Soft tissue defect/laceration along the radial aspect second digit. Osseous defect at the head of the second middle phalanx  with corticated fragments in the soft tissue. Fracture involving the base of the first distal phalanx. Involvement of the distal interphalangeal joint. Electronically Signed   By: Lovena Le M.D.   On: 04/19/2020 14:57    Procedures Procedures (including critical care time)  Medications Ordered in ED Medications  ceFAZolin (ANCEF) IVPB 1 g/50 mL premix (has no administration in time range)  morphine 2 MG/ML injection 2 mg (2 mg Intravenous Given 04/19/20 1415)  sodium chloride 0.9 % bolus 1,000 mL (0 mLs Intravenous Stopped 04/19/20 1502)  bupivacaine (MARCAINE) 0.5 % injection 10 mL (10 mLs Infiltration Given 04/19/20 1501)    ED Course  I have reviewed the triage vital signs and the nursing notes.  Pertinent labs & imaging results that were available during my care of the patient were reviewed by me and considered in my medical decision making (see chart for details).    MDM Rules/Calculators/A&P     Patient arrived via EMS status post partial finger amputation while at work.  Patient reports working with his chainsaw, when he suddenly cut the distal aspect of his right index finger.  Bleeding was controlled with pressure, dressing applied by EMS.  Last tetanus vaccine was in November 2020.  Patient arrived with vital signs, teary-eyed and actively crying during evaluation.  Extensive past medical history including MIs,  and asthma.  He is hemodynamically stable, no other injury noted.  Right finger does appear partially amputated with tendon exposed.  Sensation remains intact although somewhat numb to the distal aspect of right index finger.  Unable to flex finger.  Pulses are present, finger somewhat discolored, please see photos attached.   2:11 PM spoke to Hilbert Odor orthopedic PA who will evaluate patient while in the ED.  Patient provided with 2 mg IV morphine to help with symptoms. Patient was provided with IV ancef 1mg  for prophylaxis.   3:16 PM patient is aware he will be going to the OR from the ED.  Antibiotics have been order, he is agreeable of plan.  Vitals are within normal limits.  Patient stable for further management.   Portions of this note were generated with [employer]. Dictation errors may occur despite best attempts at proofreading.  Final Clinical Impression(s) / ED Diagnoses Final diagnoses:  Partial traumatic amputation of right index finger through metacarpophalangeal (MCP) joint, initial encounter    Rx / DC Orders ED Discharge Orders    None       Janeece Fitting, PA-C 04/19/20 1517    Gareth Morgan, MD 04/19/20 2205

## 2020-04-19 NOTE — Op Note (Signed)
Operative note 04/19/2020  Roseanne Kaufman MD.  Preoperative diagnosis right index finger chainsaw injury with near complete amputation, loss of bone and loss of neurovascular elements.  Postop diagnosis the same.  Operative procedure #1 irrigation debridement skin subtenons tissue bone tendon and soft tissue secondary to open fracture this was an excisional debridement #2 completion and revision amputation right index finger with bilateral neurectomies and skeletal shortening.  This was a P2 level amputation #3 rotation flap right index finger for soft tissue coverage  Certainly Isaac Contreras  Anesthesia General  Estimated blood loss minimal  Tourniquet time less than an hour.  Operative procedure patient was seen by myself and anesthesia.  He was counseled extensively in the holding area.  I discussed with him the loss of bone and neurovascular elements.  He understood options and risk and benefits.  Once in the operative theater the patient underwent smooth induction of general anesthesia was prepped and draped in the usual sterile fashion with Hibiclens prescrub followed by 10-minute surgical Betadine scrub and paint.  Once this was complete the patient then underwent very careful and cautious approach to the finger with timeout being observed and irrigation and debridement.  I performed irrigation debridement of skin subtenons tissue bone tendon and associated soft tissue structures this was an excisional debridement with curette knife and scissor.  Following this it was very apparent the patient did not have a good viable option for reconstruction.  The neurovascular elements were highly injured and the bony abnormality very significant as well.  It is very apparent he would need a completion of his amputation.  This time I sculpted a ulnar based skin flap and following this remove the portion of the distal phalanx followed by primary skeletal shortening of P2.  Once this was performed I then  performed bilateral neurectomies with crushing and cauterization technique and made sure the damaged arterial system was coagulated with bipolar electrocautery.  We then placed 3 L of fluid into the wound for lavage purposes.  Following this I then made back and step cuts and perform rotation flap placement so as to achieve primary coverage over the distal portion of the middle phalanx to try and save as much length as possible.  I performed a brief inflation of the tourniquet only during the procedure.  The rotation flap inset nicely and I have good bony coverage just sculpted the skin flaps and rotation flap nicely and closed the wound with chromic suture and 4-0 Prolene suture.  Patient tolerated this well.  10 cc of Sensorcaine without epinephrine was placed in the wound for postop analgesia.  The patient tolerated this well and there were no complicating features.  I will DC him home tonight on Keflex oxycodone Robaxin and return to see me in the office in 14 days.  Standard dressing and splint were applied without difficulty.  I discussed all issues with the patient's family.  [name] MD

## 2020-04-19 NOTE — Anesthesia Preprocedure Evaluation (Addendum)
Anesthesia Evaluation  Patient identified by MRN, date of birth, ID band Patient awake    Reviewed: Allergy & Precautions, NPO status , Patient's Chart, lab work & pertinent test results  History of Anesthesia Complications Negative for: history of anesthetic complications  Airway Mallampati: I  TM Distance: >3 FB Neck ROM: Full    Dental  (+) Poor Dentition, Missing, Dental Advisory Given, Caps   Pulmonary COPD (only uses inhaler once per month), former smoker,  04/19/2020 SARS coronavirus NEG   breath sounds clear to auscultation       Cardiovascular hypertension, Pt. on medications (-) angina Rhythm:Regular Rate:Normal  '15 ECHO: EF 50-55%, valves OK   Neuro/Psych negative neurological ROS     GI/Hepatic negative GI ROS, Neg liver ROS,   Endo/Other  diabetes (off meds now, glu 94)  Renal/GU Renal InsufficiencyRenal disease (creat 1.45)     Musculoskeletal   Abdominal (+) + obese,   Peds  Hematology negative hematology ROS (+)   Anesthesia Other Findings Chainsaw accident  Reproductive/Obstetrics                            Anesthesia Physical Anesthesia Plan  ASA: II  Anesthesia Plan: General   Post-op Pain Management:    Induction: Intravenous  PONV Risk Score and Plan: 2 and Ondansetron and Dexamethasone  Airway Management Planned: LMA  Additional Equipment: None  Intra-op Plan:   Post-operative Plan:   Informed Consent: I have reviewed the patients History and Physical, chart, labs and discussed the procedure including the risks, benefits and alternatives for the proposed anesthesia with the patient or authorized representative who has indicated his/her understanding and acceptance.     Dental advisory given  Plan Discussed with: CRNA and Surgeon  Anesthesia Plan Comments:        Anesthesia Quick Evaluation

## 2020-04-19 NOTE — H&P (Signed)
Patient has been seen and examined.  Patient has a right index finger near amputation after chainsaw injury with severe bone loss about the condylar aspects of the distal interphalangeal joint.  I discussed the issues with the patient.  At present juncture the patient has a severe injury.  I discussed with her options of completion of amputation/revision amputation versus attempted primary fusion.  We will evaluate him interoperatively and make the surgical decisions accordingly.  I discussed with him relevant issues features and other aspects of the care plan.  I performed a thorough and comprehensive chart review and evaluation today.  All questions have been encouraged and answered.  The patient is alert and oriented in no acute distress. The patient complains of pain in the affected upper extremity.  The patient is noted to have a normal HEENT exam. Lung fields show equal chest expansion and no shortness of breath. Abdomen exam is nontender without distention. Lower extremity examination does not show any fracture dislocation or blood clot symptoms. Pelvis is stable and the neck and back are stable and nontender.  Capri Raben MD

## 2020-04-19 NOTE — ED Triage Notes (Signed)
Pt hit his Rt index finger onto the chainsaw blade & cut to the bone around 75% of his finger. The finger is still attached upon arrival to ED pt is A/Ox4, verbal - able to make needs known.

## 2020-04-19 NOTE — Anesthesia Procedure Notes (Signed)
Procedure Name: LMA Insertion Date/Time: 04/19/2020 6:15 PM Performed by: Clovis Cao, CRNA Pre-anesthesia Checklist: Patient identified, Emergency Drugs available, Suction available, Patient being monitored and Timeout performed Patient Re-evaluated:Patient Re-evaluated prior to induction Oxygen Delivery Method: Circle system utilized Preoxygenation: Pre-oxygenation with 100% oxygen Induction Type: IV induction Ventilation: Mask ventilation without difficulty LMA: LMA inserted LMA Size: 4.0 Tube type: Oral Number of attempts: 1 Placement Confirmation: positive ETCO2 and breath sounds checked- equal and bilateral Tube secured with: Tape Dental Injury: Teeth and Oropharynx as per pre-operative assessment

## 2020-04-19 NOTE — Discharge Instructions (Signed)

## 2020-04-19 NOTE — Consult Note (Signed)
Reason for Consult:Partial amputation right index finger Referring Physician: E Cap [name] is an 64 y.o. male.  HPI: Isaac Contreras was using a chain saw to cut a tree in his yard when he slipped and cut his right index finger. He had immediate pain and a significant wound and came to the ED for evaluation and hand surgery was consulted. He is RHD and retired.  Past Medical History:  Diagnosis Date  . Acute MI ([place]) 1994   reports prior MI in [place]  . Arthritis   . Asthma   . Bronchitis   . Diabetes ([place])    diet controlled   . Diabetes mellitus without complication ([place])   . HLD (hyperlipidemia)   . Hx of adenomatous colonic polyps 02/22/2016  . Hypertension   . Tobacco abuse     Past Surgical History:  Procedure Laterality Date  . APPENDECTOMY    . COLONOSCOPY  09/07/2008   [place], [place] tics, hems  . cyst removed off back      Family History  Problem Relation Age of Onset  . Cancer Mother   . Colon cancer Neg Hx     Social History:  reports that he quit smoking about 4 years ago. His smoking use included cigarettes. He has a 7.50 pack-year smoking history. He has never used smokeless tobacco. He reports that he does not drink alcohol and does not use drugs.  Allergies: No Known Allergies  Medications: I have reviewed the patient's current medications.  No results found for this or any previous visit (from the past 48 hour(s)).  No results found.  Review of Systems  HENT: Negative for ear discharge, ear pain, hearing loss and tinnitus.   Eyes: Negative for photophobia and pain.  Respiratory: Negative for cough and shortness of breath.   Cardiovascular: Negative for chest pain.  Gastrointestinal: Negative for abdominal pain, nausea and vomiting.  Genitourinary: Negative for dysuria, flank pain, frequency and urgency.  Musculoskeletal: Positive for arthralgias (Right index finger). Negative for back pain, myalgias and neck pain.  Neurological: Negative for dizziness  and headaches.  Hematological: Does not bruise/bleed easily.  Psychiatric/Behavioral: The patient is not nervous/anxious.    Blood pressure (!) 165/79, pulse 83, temperature 98.6 F (37 C), temperature source Oral, resp. rate 18, SpO2 94 %. Physical Exam  Constitutional: He appears well-developed. No distress.  HENT:  Head: Normocephalic and atraumatic.  Eyes: Conjunctivae are normal. Right eye exhibits no discharge. Left eye exhibits no discharge. No scleral icterus.  Cardiovascular: Normal rate and regular rhythm.  Respiratory: Effort normal. No respiratory distress.  Musculoskeletal:     Cervical back: Normal range of motion.     Comments: Right shoulder, elbow, wrist, digits- Partial amputation index finger at DIP joint, radial aspect, flex/ext intact but radial aspect paresthetic, severe TTP, no instability, no blocks to motion  Sens  Ax/R/M/U intact  Mot   Ax/ R/ PIN/ M/ AIN/ U intact  Rad 2+   Neurological: He is alert.  Skin: Skin is warm and dry. He is not diaphoretic.  Psychiatric: His behavior is normal.    Assessment/Plan: Right index finger partial amputation -- Plan I&D, repair as necessary. Anticipate discharge after surgery. Multiple medical problems including CAD, HTN, HLD, and DM     Lisette Abu, PA-C Orthopedic Surgery [phone] 04/19/2020, 2:58 PM

## 2020-04-19 NOTE — Transfer of Care (Signed)
Immediate Anesthesia Transfer of Care Note  Patient: Isaac Contreras  Procedure(s) Performed: IRRIGATION AND DEBRIDEMENT EXTREMITY (Right )  Patient Location: PACU  Anesthesia Type:General  Level of Consciousness: awake, alert  and oriented  Airway & Oxygen Therapy: Patient Spontanous Breathing and Patient connected to face mask oxygen  Post-op Assessment: Report given to RN and Post -op Vital signs reviewed and stable  Post vital signs: Reviewed and stable  Last Vitals:  Vitals Value Taken Time  BP 163/96 04/19/20 1921  Temp 97.8   Pulse 81 04/19/20 1922  Resp 19 04/19/20 1922  SpO2 98 % 04/19/20 1922  Vitals shown include unvalidated device data.  Last Pain:  Vitals:   04/19/20 1502  TempSrc:   PainSc: 7      Glu 99    Complications: No complications documented.

## 2020-04-21 NOTE — Anesthesia Postprocedure Evaluation (Signed)
Anesthesia Post Note  Patient: Isaac Contreras  Procedure(s) Performed: IRRIGATION AND DEBRIDEMENT EXTREMITY (Right )     Patient location during evaluation: PACU Anesthesia Type: General Level of consciousness: awake and alert Pain management: pain level controlled Vital Signs Assessment: post-procedure vital signs reviewed and stable Respiratory status: spontaneous breathing, nonlabored ventilation, respiratory function stable and patient connected to nasal cannula oxygen Cardiovascular status: blood pressure returned to baseline and stable Postop Assessment: no apparent nausea or vomiting Anesthetic complications: no   No complications documented.  Last Vitals:  Vitals:   04/19/20 1945 04/19/20 2000  BP: (!) 151/86 (!) 153/94  Pulse: 69 70  Resp: (!) 24 13  Temp:  36.7 C  SpO2: 98% 99%    Last Pain:  Vitals:   04/19/20 2000  TempSrc:   PainSc: 0-No pain                 Misha Antonini S

## 2020-04-22 ENCOUNTER — Encounter (HOSPITAL_COMMUNITY): Payer: Self-pay | Admitting: Orthopedic Surgery

## 2021-06-11 ENCOUNTER — Encounter: Payer: Self-pay | Admitting: Internal Medicine

## 2021-07-03 ENCOUNTER — Ambulatory Visit (AMBULATORY_SURGERY_CENTER): Payer: Medicare HMO

## 2021-07-03 ENCOUNTER — Other Ambulatory Visit: Payer: Self-pay

## 2021-07-03 VITALS — Ht 69.0 in | Wt 228.0 lb

## 2021-07-03 DIAGNOSIS — Z86010 Personal history of colonic polyps: Secondary | ICD-10-CM

## 2021-07-03 NOTE — Progress Notes (Signed)
  Patient's pre-visit was done today over the phone with the patient   Name,DOB and address verified.   Patient denies any allergies to Eggs and Soy.  Patient denies any problems with anesthesia/sedation. Patient denies taking diet pills or blood thinners.  Denies atrial flutter or atrial fib Denies chronic constipation No home Oxygen.   Packet of Prep instructions mailed to patient including a copy of a consent form-pt is aware.  Patient understands to call us back with any questions or concerns.  Patient is aware of our care-partner policy and Covid-19 safety protocol.   The patient is COVID-19 vaccinated.    

## 2021-07-18 ENCOUNTER — Encounter: Payer: Self-pay | Admitting: Internal Medicine

## 2021-07-31 ENCOUNTER — Ambulatory Visit (AMBULATORY_SURGERY_CENTER): Payer: Medicare HMO | Admitting: Internal Medicine

## 2021-07-31 ENCOUNTER — Other Ambulatory Visit: Payer: Self-pay

## 2021-07-31 ENCOUNTER — Encounter: Payer: Self-pay | Admitting: Internal Medicine

## 2021-07-31 VITALS — BP 100/67 | HR 67 | Temp 98.6°F | Resp 12 | Ht 69.0 in | Wt 228.0 lb

## 2021-07-31 DIAGNOSIS — Z86010 Personal history of colonic polyps: Secondary | ICD-10-CM

## 2021-07-31 DIAGNOSIS — D122 Benign neoplasm of ascending colon: Secondary | ICD-10-CM

## 2021-07-31 MED ORDER — SODIUM CHLORIDE 0.9 % IV SOLN: 500.0000 mL | Freq: Once | INTRAVENOUS | Status: DC

## 2021-07-31 NOTE — Progress Notes (Signed)
Called to room to assist during endoscopic procedure.  Patient ID and intended procedure confirmed with present staff. Received instructions for my participation in the procedure from the performing physician.Called to room to assist during endoscopic procedure.  Patient ID and intended procedure confirmed with present staff. Received instructions for my participation in the procedure from the performing physician. 

## 2021-07-31 NOTE — Patient Instructions (Addendum)
I found and removed one tiny polyp.  I will let you know pathology results and when to have another routine colonoscopy by mail and/or My Chart.  It should be at least 7 years for next one.  I appreciate the opportunity to care for you. Gatha Mayer, MD, FACG   YOU HAD AN ENDOSCOPIC PROCEDURE TODAY AT [facility]:   Refer to the procedure report that was given to you for any specific questions about what was found during the examination.  If the procedure report does not answer your questions, please call your gastroenterologist to clarify.  If you requested that your care partner not be given the details of your procedure findings, then the procedure report has been included in a sealed envelope for you to review at your convenience later.  YOU SHOULD EXPECT: Some feelings of bloating in the abdomen. Passage of more gas than usual.  Walking can help get rid of the air that was put into your GI tract during the procedure and reduce the bloating. If you had a lower endoscopy (such as a colonoscopy or flexible sigmoidoscopy) you may notice spotting of blood in your stool or on the toilet paper. If you underwent a bowel prep for your procedure, you may not have a normal bowel movement for a few days.  Please Note:  You might notice some irritation and congestion in your nose or some drainage.  This is from the oxygen used during your procedure.  There is no need for concern and it should clear up in a day or so.  SYMPTOMS TO REPORT IMMEDIATELY:  Following lower endoscopy (colonoscopy or flexible sigmoidoscopy):  Excessive amounts of blood in the stool  Significant tenderness or worsening of abdominal pains  Swelling of the abdomen that is new, acute  Fever of 100F or higher  For urgent or emergent issues, a gastroenterologist can be reached at any hour by calling [phone]. Do not use MyChart messaging for urgent concerns.    DIET:  We do recommend a small meal at  first, but then you may proceed to your regular diet.  Drink plenty of fluids but you should avoid alcoholic beverages for 24 hours.  ACTIVITY:  You should plan to take it easy for the rest of today and you should NOT DRIVE or use heavy machinery until tomorrow (because of the sedation medicines used during the test).    FOLLOW UP: Our staff will call the number listed on your records 48-72 hours following your procedure to check on you and address any questions or concerns that you may have regarding the information given to you following your procedure. If we do not reach you, we will leave a message.  We will attempt to reach you two times.  During this call, we will ask if you have developed any symptoms of COVID 19. If you develop any symptoms (ie: fever, flu-like symptoms, shortness of breath, cough etc.) before then, please call [phone].  If you test positive for Covid 19 in the 2 weeks post procedure, please call and report this information to [place].    If any biopsies were taken you will be contacted by phone or by letter within the next 1-3 weeks.  Please call us at [phone] if you have not heard about the biopsies in 3 weeks.    SIGNATURES/CONFIDENTIALITY: You and/or your care partner have signed paperwork which will be entered into your electronic medical record.  These signatures attest to  the fact that that the information above on your After Visit Summary has been reviewed and is understood.  Full responsibility of the confidentiality of this discharge information lies with you and/or your care-partner.

## 2021-07-31 NOTE — Op Note (Signed)
[place] Patient Name: Isaac Contreras Procedure Date: 07/31/2021 9:21 AM MRN: [id number] Endoscopist: Gatha Mayer , MD Age: 65 Referring MD:  Date of Birth: 1955-12-21 Gender: Male Account #: [id number] Procedure:                Colonoscopy Indications:              Surveillance: Personal history of adenomatous                            polyps on last colonoscopy 5 years ago, Last                            colonoscopy: 2017 Medicines:                Propofol per Anesthesia, Monitored Anesthesia Care Procedure:                Pre-Anesthesia Assessment:                           - Prior to the procedure, a History and Physical                            was performed, and patient medications and                            allergies were reviewed. The patient's tolerance of                            previous anesthesia was also reviewed. The risks                            and benefits of the procedure and the sedation                            options and risks were discussed with the patient.                            All questions were answered, and informed consent                            was obtained. Prior Anticoagulants: The patient has                            taken no previous anticoagulant or antiplatelet                            agents. ASA Grade Assessment: III - A patient with                            severe systemic disease. After reviewing the risks                            and benefits, the patient was deemed in  satisfactory condition to undergo the procedure.                           After obtaining informed consent, the colonoscope                            was passed under direct vision. Throughout the                            procedure, the patient's blood pressure, pulse, and                            oxygen saturations were monitored continuously. The                            Olympus CF-HQ190L  [id number]) Colonoscope was                            introduced through the anus and advanced to the the                            cecum, identified by appendiceal orifice and                            ileocecal valve. The colonoscopy was performed                            without difficulty. The patient tolerated the                            procedure well. The quality of the bowel                            preparation was good. The ileocecal valve,                            appendiceal orifice, and rectum were photographed.                            The bowel preparation used was Miralax via split                            dose instruction. Scope In: 9:40:50 AM Scope Out: 9:55:49 AM Scope Withdrawal Time: 0 hours 12 minutes 28 seconds  Total Procedure Duration: 0 hours 14 minutes 59 seconds  Findings:                 The perianal and digital rectal examinations were                            normal. Pertinent negatives include normal prostate                            (size, shape, and consistency).  Multiple diverticula were found in the entire colon.                           A 1 mm polyp was found in the ascending colon. The                            polyp was sessile. The polyp was removed with a                            cold biopsy forceps. Resection and retrieval were                            complete. Verification of patient identification                            for the specimen was done. Estimated blood loss was                            minimal.                           The exam was otherwise without abnormality on                            direct and retroflexion views. Complications:            No immediate complications. Estimated Blood Loss:     Estimated blood loss was minimal. Impression:               - Diverticulosis in the entire examined colon.                           - One 1 mm polyp in the ascending colon, removed                             with a cold biopsy forceps. Resected and retrieved.                           - The examination was otherwise normal on direct                            and retroflexion views.                           - Personal history of colonic polyps. 2 diminutive                            adenomas 2017 Recommendation:           - Patient has a contact number available for                            emergencies. The signs and symptoms of potential  delayed complications were discussed with the                            patient. Return to normal activities tomorrow.                            Written discharge instructions were provided to the                            patient.                           - Resume previous diet.                           - Continue present medications.                           - Repeat colonoscopy is recommended for                            surveillance. The colonoscopy date will be                            determined after pathology results from today's                            exam become available for review. Gatha Mayer, MD 07/31/2021 10:05:01 AM This report has been signed electronically.

## 2021-07-31 NOTE — Progress Notes (Signed)
Vss nad transferred to pacu 

## 2021-07-31 NOTE — Progress Notes (Signed)
CW-VS Pt's states no medical or surgical changes since previsit or office visit.

## 2021-07-31 NOTE — Progress Notes (Signed)
[facility] History and Physical   Primary Care Physician:  Suella Broad, FNP   Reason for Procedure:   Hx adenomatous colon polyps  Plan:    colonoscopy     HPI: Isaac Contreras. is a 65 y.o. male w/ hx 2 diminutiove adenomatous colon polyps removed in 2017 - here for follow-up colonoscopy   Past Medical History:  Diagnosis Date   Acute MI ([place]) 1994   reports prior MI in [place]   Arthritis    Asthma    Bronchitis    Diabetes ([place])    diet controlled    Diabetes mellitus without complication ([place])    HLD (hyperlipidemia)    Hx of adenomatous colonic polyps 02/22/2016   Hypertension    Tobacco abuse     Past Surgical History:  Procedure Laterality Date   APPENDECTOMY     COLONOSCOPY  09/07/2008   [place], [place] tics, hems   cyst removed off back     I & D EXTREMITY Right 04/19/2020   Procedure: IRRIGATION AND DEBRIDEMENT EXTREMITY;  Surgeon: Roseanne Kaufman, MD;  Location: [facility];  Service: Orthopedics;  Laterality: Right;    Prior to Admission medications   Medication Sig Start Date End Date Taking? Authorizing Provider  albuterol (PROVENTIL HFA;VENTOLIN HFA) 108 (90 Base) MCG/ACT inhaler Inhale 2 puffs into the lungs every 6 (six) hours as needed for wheezing. 11/21/15  Yes Jaynee Eagles, PA-C  amLODipine (NORVASC) 10 MG tablet Take 10 mg by mouth daily.   Yes [provider]  aspirin 81 MG tablet Take 81 mg by mouth daily.   Yes [provider]  isosorbide mononitrate (IMDUR) 30 MG 24 hr tablet Take 30 mg by mouth daily. 12/28/19  Yes [provider]  rosuvastatin (CRESTOR) 10 MG tablet Take 1 tablet (10 mg total) by mouth daily. 03/14/20  Yes Couture, Cortni S, PA-C  glucose blood (ACCU-CHEK AVIVA) test strip Test glucose with fingerstick once daily.  Diag: E11.9 05/21/16   Jaynee Eagles, PA-C  Lancets (ACCU-CHEK SAFE-T PRO) lancets Test glucose with fingerstick once daily. Diag: E11.9 05/21/16   Jaynee Eagles, PA-C  Lancets  MISC Check blood sugar three times daily. 01/16/16   Jaynee Eagles, PA-C  omeprazole (PRILOSEC) 20 MG capsule Take 1 capsule (20 mg total) by mouth 2 (two) times daily before a meal for 14 days. 03/07/20 03/21/20  Wieters, Elesa Hacker, PA-C    Current Outpatient Medications  Medication Sig Dispense Refill   albuterol (PROVENTIL HFA;VENTOLIN HFA) 108 (90 Base) MCG/ACT inhaler Inhale 2 puffs into the lungs every 6 (six) hours as needed for wheezing. 8 g 6   amLODipine (NORVASC) 10 MG tablet Take 10 mg by mouth daily.     aspirin 81 MG tablet Take 81 mg by mouth daily.     isosorbide mononitrate (IMDUR) 30 MG 24 hr tablet Take 30 mg by mouth daily.     rosuvastatin (CRESTOR) 10 MG tablet Take 1 tablet (10 mg total) by mouth daily. 30 tablet 0   glucose blood (ACCU-CHEK AVIVA) test strip Test glucose with fingerstick once daily.  Diag: E11.9 100 each 12   Lancets (ACCU-CHEK SAFE-T PRO) lancets Test glucose with fingerstick once daily. Diag: E11.9 100 each 12   Lancets MISC Check blood sugar three times daily. 100 each 2   omeprazole (PRILOSEC) 20 MG capsule Take 1 capsule (20 mg total) by mouth 2 (two) times daily before a meal for 14 days. 28 capsule 0   Current  Facility-Administered Medications  Medication Dose Route Frequency Provider Last Rate Last Admin   0.9 %  sodium chloride infusion  500 mL Intravenous Once Gatha Mayer, MD        Allergies as of 07/31/2021   (No Known Allergies)    Family History  Problem Relation Age of Onset   Cancer Mother    Colon cancer Neg Hx    Colon polyps Neg Hx    Esophageal cancer Neg Hx    Stomach cancer Neg Hx    Rectal cancer Neg Hx     Social History   Socioeconomic History   Marital status: Single    Spouse name: Not on file   Number of children: Not on file   Years of education: Not on file   Highest education level: Not on file  Occupational History   Not on file  Tobacco Use   Smoking status: Former    Packs/day: 0.50    Years: 15.00     Pack years: 7.50    Types: Cigarettes    Quit date: 11/27/2015    Years since quitting: 5.6   Smokeless tobacco: Never  Vaping Use   Vaping Use: Never used  Substance and Sexual Activity   Alcohol use: No   Drug use: No   Sexual activity: Yes    Birth control/protection: None            Review of Systems:  other review of systems negative except as mentioned in the HPI.  Physical Exam: Vital signs BP 127/75   Pulse 70   Temp 98.6 F (37 C) (Temporal)   Ht 5\' 9"  (1.753 m)   Wt 228 lb (103.4 kg)   SpO2 99%   BMI 33.67 kg/m   General:   Alert,  Well-developed, well-nourished, pleasant and cooperative in NAD Lungs:  Clear throughout to auscultation.   Heart:  Regular rate and rhythm; no murmurs, clicks, rubs,  or gallops. Abdomen:  Soft, nontender and nondistended. Normal bowel sounds.   Neuro/Psych:  Alert and cooperative. Normal mood and affect. A and O x 3   @Shristi Scheib  Simonne Maffucci, MD, [facility] [phone] (pager) 07/31/2021 9:28 AM@

## 2021-08-04 ENCOUNTER — Telehealth: Payer: Self-pay | Admitting: *Deleted

## 2021-08-04 NOTE — Telephone Encounter (Signed)
  Follow up Call-  Call back number 07/31/2021  Post procedure Call Back phone  # [phone]  Permission to leave phone message Yes  Some recent data might be hidden     Patient questions:  Do you have a fever, pain , or abdominal swelling? No. Pain Score  0 *  Have you tolerated food without any problems? Yes.    Have you been able to return to your normal activities? Yes.    Do you have any questions about your discharge instructions: Diet   No. Medications  No. Follow up visit  No.  Do you have questions or concerns about your Care? No.  Actions: * If pain score is 4 or above: No action needed, pain <4.Have you developed a fever since your procedure? no  2.   Have you had an respiratory symptoms (SOB or cough) since your procedure? no  3.   Have you tested positive for COVID 19 since your procedure no  4.   Have you had any family members/close contacts diagnosed with the COVID 19 since your procedure?  no   If yes to any of these questions please route to Joylene John, RN and Joella Prince, RN

## 2021-08-04 NOTE — Telephone Encounter (Signed)
First follow up call attempt.  LVM. 

## 2021-08-07 ENCOUNTER — Encounter: Payer: Self-pay | Admitting: Internal Medicine

## 2021-10-05 ENCOUNTER — Encounter (HOSPITAL_COMMUNITY): Payer: Self-pay | Admitting: *Deleted

## 2021-10-05 ENCOUNTER — Ambulatory Visit (HOSPITAL_COMMUNITY)
Admission: EM | Admit: 2021-10-05 | Discharge: 2021-10-05 | Disposition: A | Discharge status: Home / Self Care | Payer: Medicare HMO

## 2021-10-05 ENCOUNTER — Other Ambulatory Visit: Payer: Self-pay

## 2021-10-05 ENCOUNTER — Ambulatory Visit (HOSPITAL_COMMUNITY)
Admission: EM | Admit: 2021-10-05 | Discharge: 2021-10-05 | Disposition: A | Discharge status: Home / Self Care | Payer: Medicare HMO | Attending: Physician Assistant | Admitting: Physician Assistant

## 2021-10-05 DIAGNOSIS — J329 Chronic sinusitis, unspecified: Secondary | ICD-10-CM | POA: Diagnosis not present

## 2021-10-05 DIAGNOSIS — J441 Chronic obstructive pulmonary disease with (acute) exacerbation: Secondary | ICD-10-CM

## 2021-10-05 DIAGNOSIS — J40 Bronchitis, not specified as acute or chronic: Secondary | ICD-10-CM | POA: Diagnosis not present

## 2021-10-05 MED ORDER — BUDESONIDE-FORMOTEROL FUMARATE 80-4.5 MCG/ACT IN AERO
2.0000 | INHALATION_SPRAY | Freq: Two times a day (BID) | RESPIRATORY_TRACT | 0 refills | Status: AC
Start: 2021-10-05 — End: ?

## 2021-10-05 MED ORDER — DOXYCYCLINE HYCLATE 100 MG PO CAPS
100.0000 mg | ORAL_CAPSULE | Freq: Two times a day (BID) | ORAL | 0 refills | Status: DC
Start: 2021-10-05 — End: 2023-03-31

## 2021-10-05 MED ORDER — PREDNISONE 20 MG PO TABS
40.0000 mg | ORAL_TABLET | Freq: Every day | ORAL | 0 refills | Status: AC
Start: 2021-10-05 — End: 2021-10-09

## 2021-10-05 NOTE — Discharge Instructions (Signed)
I believe that you have a bacterial infection that is exacerbating your COPD/asthma.  Please start doxycycline twice daily.  I have also called in prednisone to help with your symptoms.  Do not take NSAIDs including aspirin, ibuprofen/Advil, naproxen/Aleve with this medication as it can cause stomach bleeding.  Use Symbicort twice daily.  Make sure to rinse your mouth and spit out the water after using this medication to prevent thrush.  If you have any worsening symptoms including shortness of breath, worsening cough, high fever, chest pain you need to go to the emergency room.  Follow-up with your PCP if symptoms have not significantly improved within a few days.

## 2021-10-05 NOTE — ED Provider Notes (Signed)
Isaac Contreras    CSN: [id number] Arrival date & time: 10/05/21  1408      History   Chief Complaint Chief Complaint  Patient presents with   Cough   Nasal Congestion   Wheezing    HPI Isaac Contreras. is a 65 y.o. male.   Patient presents today with a weeklong history of productive cough and wheezing.  He reports that symptoms are worsened over the past several days prompting evaluation today.  He does have a history of asthma/COPD and is a current everyday smoker.  Reports being hospitalized for COPD exacerbation several years ago with similar symptoms.  He reports severe cough as well as shortness of breath and wheezing particularly at night.  He has tried his albuterol inhaler as well as Tylenol and cold and flu medication without improvement of symptoms.  He denies any recent antibiotic use.  He is listed as a diabetic on his chart but reports that this is not true and he does not have diabetes.  There is no A1c available in epic.  He is not currently taking any maintenance medications but reports he has used these in the past.  He is having difficulty with daily duties as result of symptoms.   Past Medical History:  Diagnosis Date   Acute MI ([place]) 11/02/1992   reports prior MI in [place]   Arthritis    Asthma    Bronchitis    Diabetes ([place])    HLD (hyperlipidemia)    Hx of adenomatous colonic polyps 02/22/2016   Hypertension    Tobacco abuse     Patient Active Problem List   Diagnosis Date Noted   Hx of adenomatous colonic polyps 02/22/2016   Chest pain 02/05/2014   Dyspnea 02/05/2014   Hypertension    Diabetes mellitus without complication ([place])    Acute MI ([place])    Bronchitis     Past Surgical History:  Procedure Laterality Date   APPENDECTOMY     COLONOSCOPY  09/07/2008   [place], [place] tics, hems   cyst removed off back     I & D EXTREMITY Right 04/19/2020   Procedure: IRRIGATION AND DEBRIDEMENT EXTREMITY;  Surgeon: Roseanne Kaufman, MD;  Location:  [facility];  Service: Orthopedics;  Laterality: Right;       Home Medications    Prior to Admission medications   Medication Sig Start Date End Date Taking? Authorizing Provider  budesonide-formoterol (SYMBICORT) 80-4.5 MCG/ACT inhaler Inhale 2 puffs into the lungs in the morning and at bedtime. 10/05/21  Yes Gaelyn Tukes K, PA-C  doxycycline (VIBRAMYCIN) 100 MG capsule Take 1 capsule (100 mg total) by mouth 2 (two) times daily. 10/05/21  Yes Scarleth Brame K, PA-C  predniSONE (DELTASONE) 20 MG tablet Take 2 tablets (40 mg total) by mouth daily for 4 days. 10/05/21 10/09/21 Yes Rodert Hinch K, PA-C  albuterol (PROVENTIL HFA;VENTOLIN HFA) 108 (90 Base) MCG/ACT inhaler Inhale 2 puffs into the lungs every 6 (six) hours as needed for wheezing. 11/21/15   Jaynee Eagles, PA-C  amLODipine (NORVASC) 10 MG tablet Take 10 mg by mouth daily.    [provider]  aspirin 81 MG tablet Take 81 mg by mouth daily.    [provider]  glucose blood (ACCU-CHEK AVIVA) test strip Test glucose with fingerstick once daily.  Diag: E11.9 05/21/16   Jaynee Eagles, PA-C  isosorbide mononitrate (IMDUR) 30 MG 24 hr tablet Take 30 mg by mouth daily. 12/28/19   [provider]  Lancets (ACCU-CHEK SAFE-T PRO) lancets Test glucose with fingerstick once daily. Diag: E11.9 05/21/16   Jaynee Eagles, PA-C  Lancets MISC Check blood sugar three times daily. 01/16/16   Jaynee Eagles, PA-C  omeprazole (PRILOSEC) 20 MG capsule Take 1 capsule (20 mg total) by mouth 2 (two) times daily before a meal for 14 days. 03/07/20 03/21/20  Wieters, Hallie C, PA-C  rosuvastatin (CRESTOR) 10 MG tablet Take 1 tablet (10 mg total) by mouth daily. 03/14/20   Couture, Cortni S, PA-C    Family History Family History  Problem Relation Age of Onset   Cancer Mother    Colon cancer Neg Hx    Colon polyps Neg Hx    Esophageal cancer Neg Hx    Stomach cancer Neg Hx    Rectal cancer Neg Hx     Social History Social History   Tobacco Use    Smoking status: Former    Packs/day: 0.50    Years: 15.00    Pack years: 7.50    Types: Cigarettes    Quit date: 11/27/2015    Years since quitting: 5.8   Smokeless tobacco: Never  Vaping Use   Vaping Use: Never used  Substance Use Topics   Alcohol use: No   Drug use: No     Allergies   Patient has no known allergies.   Review of Systems Review of Systems  Constitutional:  Positive for activity change. Negative for appetite change, fatigue and fever.  HENT:  Negative for congestion, sinus pressure, sneezing and sore throat.   Respiratory:  Positive for cough, chest tightness and shortness of breath. Negative for wheezing.   Cardiovascular:  Negative for chest pain.  Gastrointestinal:  Negative for abdominal pain, diarrhea, nausea and vomiting.  Neurological:  Negative for dizziness, light-headedness and headaches.    Physical Exam Triage Vital Signs ED Triage Vitals [10/05/21 1648]  Enc Vitals Group     BP 139/72     Pulse Rate 73     Resp 20     Temp 97.7 F (36.5 C)     Temp src      SpO2 100 %     Weight      Height      Head Circumference      Peak Flow      Pain Score 0     Pain Loc      Pain Edu?      Excl. in [place]?    No data found.  Updated Vital Signs BP 139/72   Pulse 73   Temp 97.7 F (36.5 C)   Resp 20   SpO2 100%   Visual Acuity Right Eye Distance:   Left Eye Distance:   Bilateral Distance:    Right Eye Near:   Left Eye Near:    Bilateral Near:     Physical Exam Vitals reviewed.  Constitutional:      General: He is awake.     Appearance: Normal appearance. He is well-developed. He is not ill-appearing.     Comments: Very pleasant male appears stated age in no acute distress sitting comfortably in exam room  HENT:     Head: Normocephalic and atraumatic.     Right Ear: Tympanic membrane, ear canal and external ear normal. Tympanic membrane is not erythematous or bulging.     Left Ear: Tympanic membrane, ear canal and external ear  normal. Tympanic membrane is not erythematous or bulging.     Nose: Nose normal.  Mouth/Throat:     Pharynx: Uvula midline. No oropharyngeal exudate or posterior oropharyngeal erythema.  Cardiovascular:     Rate and Rhythm: Normal rate and regular rhythm.     Heart sounds: Normal heart sounds, S1 normal and S2 normal. No murmur heard. Pulmonary:     Effort: Pulmonary effort is normal. No accessory muscle usage or respiratory distress.     Breath sounds: No stridor. Wheezing present. No rhonchi or rales.     Comments: Scattered wheezing throughout lung fields. Neurological:     Mental Status: He is alert.  Psychiatric:        Behavior: Behavior is cooperative.     UC Treatments / Results  Labs (all labs ordered are listed, but only abnormal results are displayed) Labs Reviewed - No data to display  EKG   Radiology No results found.  Procedures Procedures (including critical care time)  Medications Ordered in UC Medications - No data to display  Initial Impression / Assessment and Plan / UC Course  I have reviewed the triage vital signs and the nursing notes.  Pertinent labs & imaging results that were available during my care of the patient were reviewed by me and considered in my medical decision making (see chart for details).     No indication for viral testing given patient has been symptomatic for almost a week and this would not change management.  Given sudden worsening of cough will cover for COPD exacerbation/bronchitis.  Vital signs were encouraging physical exam no adventitious lung sounds so x-ray was deferred.  Patient was started on doxycycline.  He was given prednisone we discussed that this can raise blood sugars.  He assured me that he does not have diabetes but was encouraged to avoid carbohydrates and drink plenty of fluid.  He was also started on Symbicort with instruction to rinse mouth following use of this medication to prevent thrush.  He is to rest  and drink plenty of fluid.  Discussed alarm symptoms that warrant emergent evaluation.  Recommended he follow-up with PCP to consider ongoing maintenance medication if this is effective.  Strict return precautions given to which he expressed understanding.  Final Clinical Impressions(s) / UC Diagnoses   Final diagnoses:  Sinobronchitis  COPD exacerbation ([place])     Discharge Instructions      I believe that you have a bacterial infection that is exacerbating your COPD/asthma.  Please start doxycycline twice daily.  I have also called in prednisone to help with your symptoms.  Do not take NSAIDs including aspirin, ibuprofen/Advil, naproxen/Aleve with this medication as it can cause stomach bleeding.  Use Symbicort twice daily.  Make sure to rinse your mouth and spit out the water after using this medication to prevent thrush.  If you have any worsening symptoms including shortness of breath, worsening cough, high fever, chest pain you need to go to the emergency room.  Follow-up with your PCP if symptoms have not significantly improved within a few days.     ED Prescriptions     Medication Sig Dispense Auth. Provider   predniSONE (DELTASONE) 20 MG tablet Take 2 tablets (40 mg total) by mouth daily for 4 days. 8 tablet Christabelle Hanzlik K, PA-C   doxycycline (VIBRAMYCIN) 100 MG capsule Take 1 capsule (100 mg total) by mouth 2 (two) times daily. 20 capsule Mandeep Kiser K, PA-C   budesonide-formoterol (SYMBICORT) 80-4.5 MCG/ACT inhaler Inhale 2 puffs into the lungs in the morning and at bedtime. 10.2 g Riva Sesma K,  PA-C      PDMP not reviewed this encounter.   Terrilee Croak, PA-C 10/05/21 1745

## 2021-10-05 NOTE — ED Triage Notes (Signed)
Pt reports since Monday he has had a cough,wheezing and congestion

## 2022-02-24 ENCOUNTER — Other Ambulatory Visit: Payer: Self-pay | Admitting: Nurse Practitioner

## 2022-02-24 DIAGNOSIS — F17210 Nicotine dependence, cigarettes, uncomplicated: Secondary | ICD-10-CM

## 2022-03-16 ENCOUNTER — Ambulatory Visit
Admission: RE | Admit: 2022-03-16 | Discharge: 2022-03-16 | Disposition: A | Discharge status: Home / Self Care | Payer: Medicare HMO | Source: Ambulatory Visit | Attending: Nurse Practitioner | Admitting: Nurse Practitioner

## 2022-03-16 DIAGNOSIS — F17210 Nicotine dependence, cigarettes, uncomplicated: Secondary | ICD-10-CM

## 2022-03-26 IMAGING — CT CT NECK W/ CM
4 of 5 series · 16 of 33 positions shown, 18 images · IV contrast (Omni 300)
Comparison: None.

CLINICAL DATA: Lymph node swelling, dysphagia.

EXAM:
CT NECK WITH CONTRAST
TECHNIQUE: Multidetector CT imaging of the neck was performed using the
standard protocol following the bolus administration of intravenous
contrast.
CONTRAST:  100mL OMNIPAQUE IOHEXOL 300 MG/ML  SOLN

[Series 3: neck 2.0 st · axial · 0.46mm/px · z∈[-213,-81]mm · 4 of 112 slices shown, 5 images]
[im 23/112  soft-tissue]
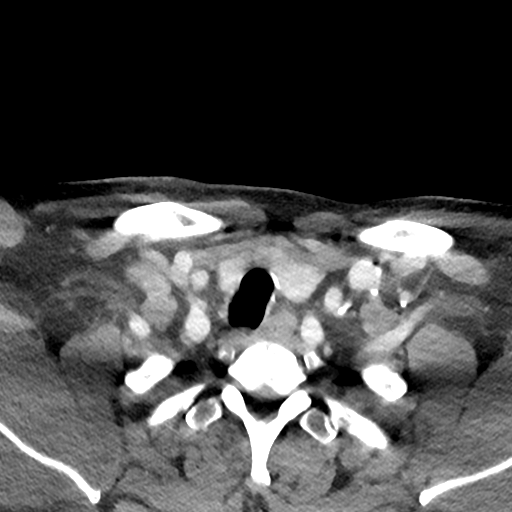
[im 23/112  bone]
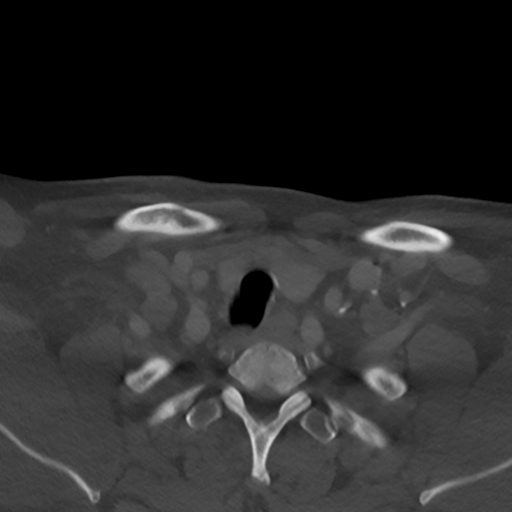
[im 45/112  bone]
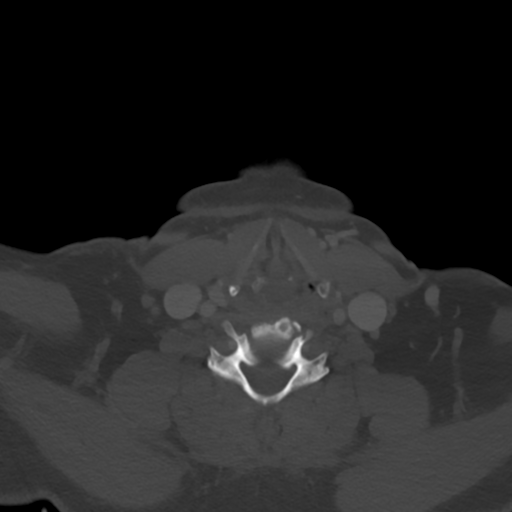
[im 67/112  bone]
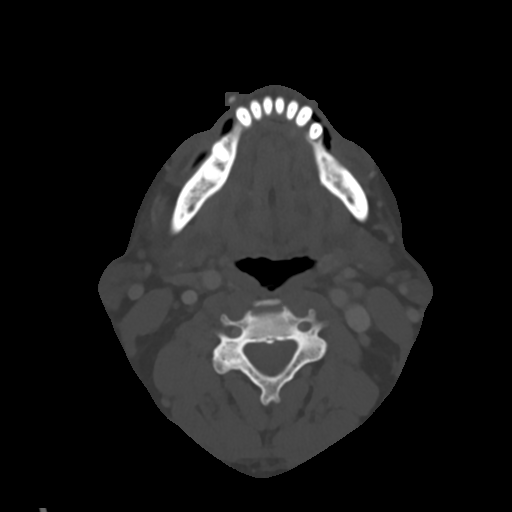
[im 89/112  bone]
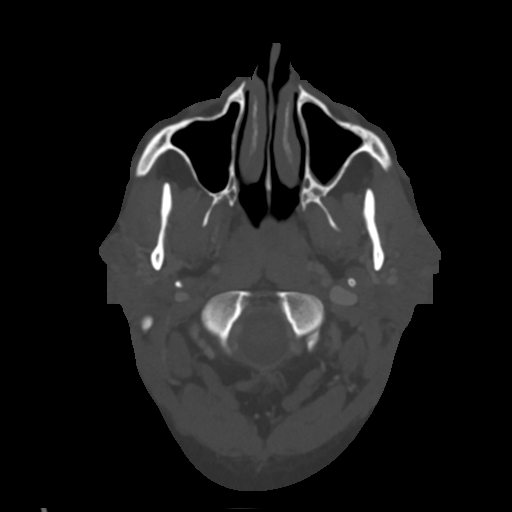

[Series 6: orthogonal · axial · 0.39mm/px · z∈[-253,-101]mm · 4 of 132 slices shown]
[im 27/132  bone]
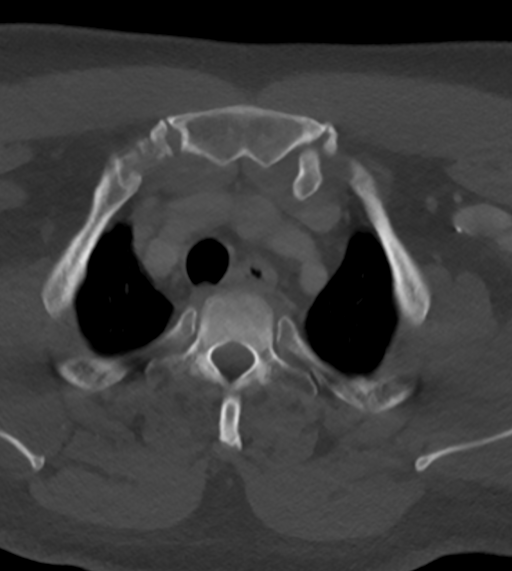
[im 53/132  bone]
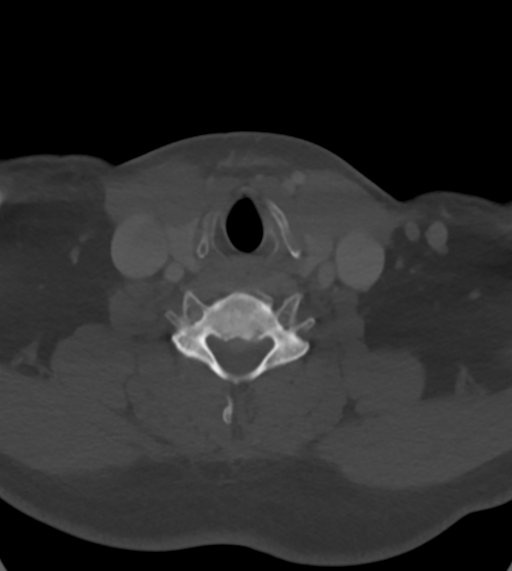
[im 79/132  bone]
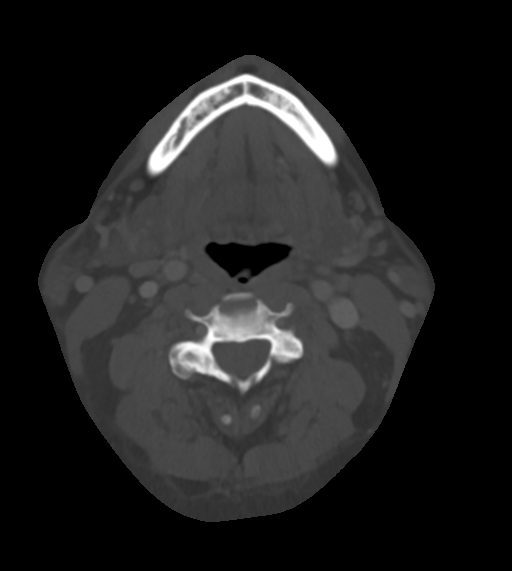
[im 105/132  bone]
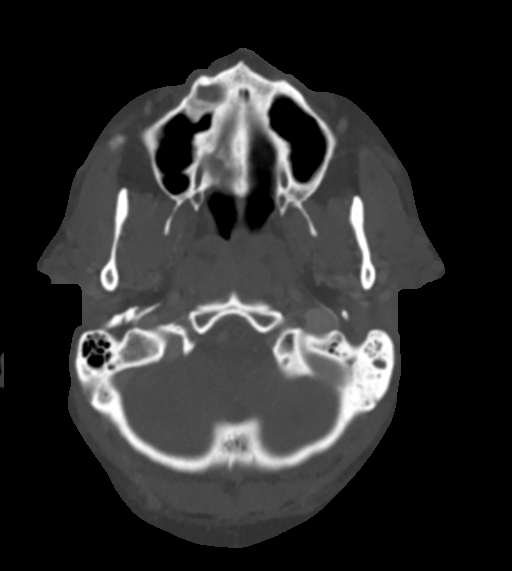

[Series 7: sagittal · sagittal · 0.47mm/px · 5 of 101 slices shown, 6 images]
[im 34/101  bone]
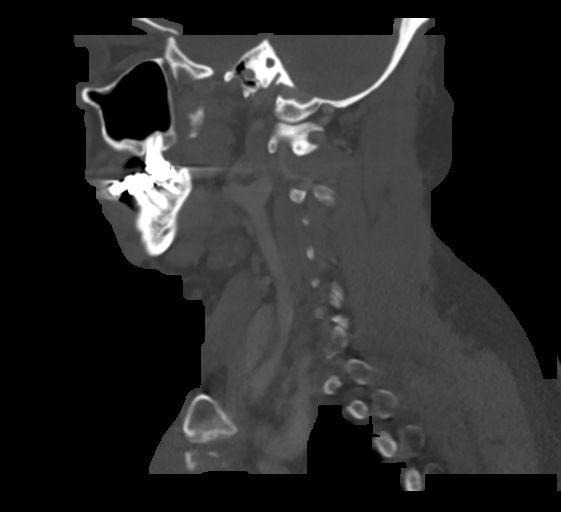
[im 42/101  bone]
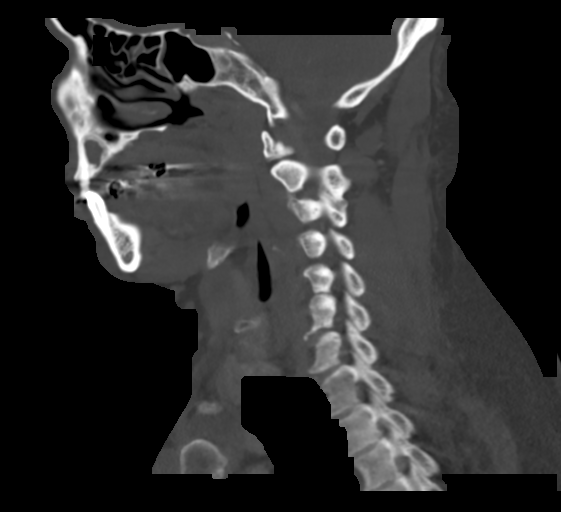
[im 51/101  soft-tissue]
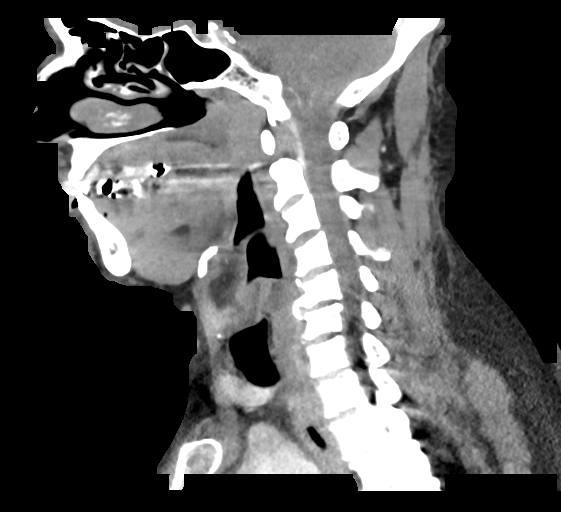
[im 51/101  bone]
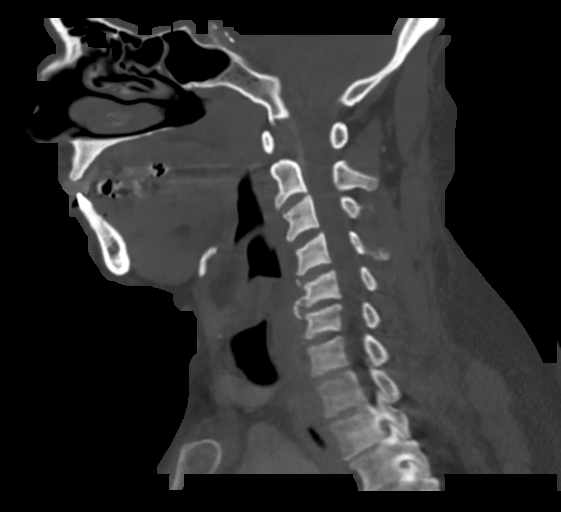
[im 59/101  bone]
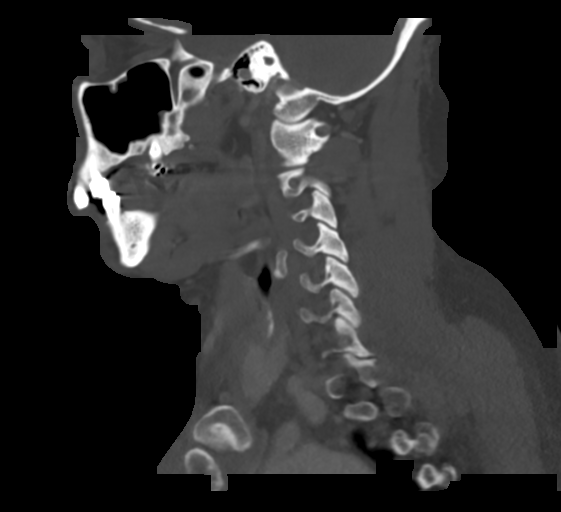
[im 67/101  bone]
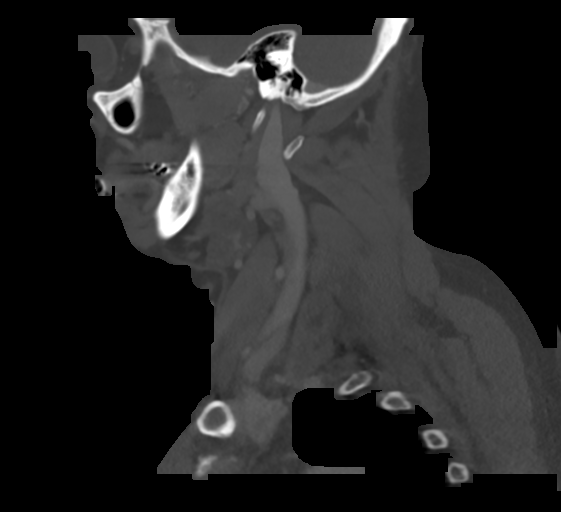

[Series 8: coronal · coronal · 0.44mm/px · 3 of 130 slices shown]
[im 26/130  bone]
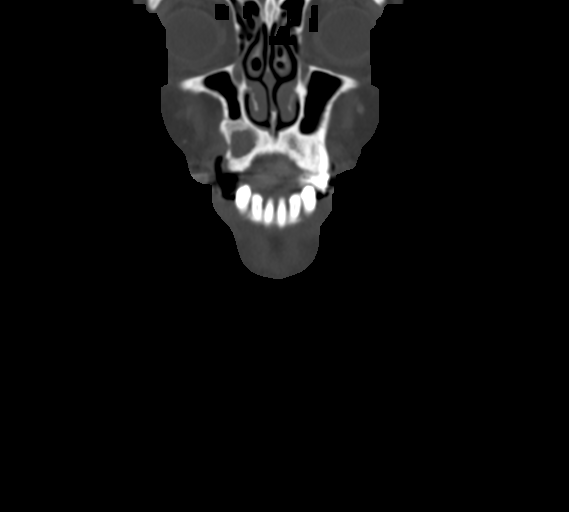
[im 52/130  bone]
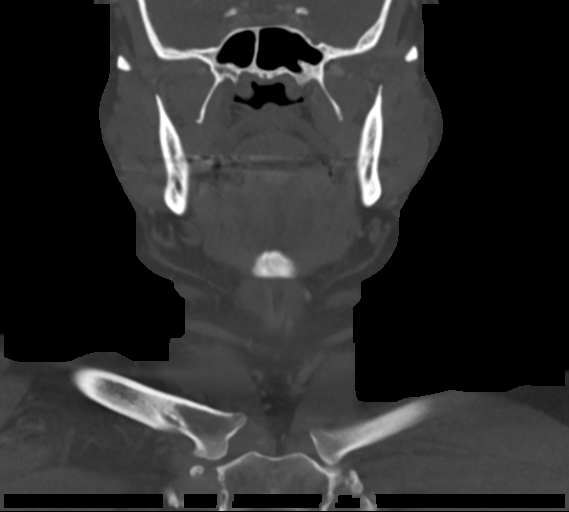
[im 78/130  bone]
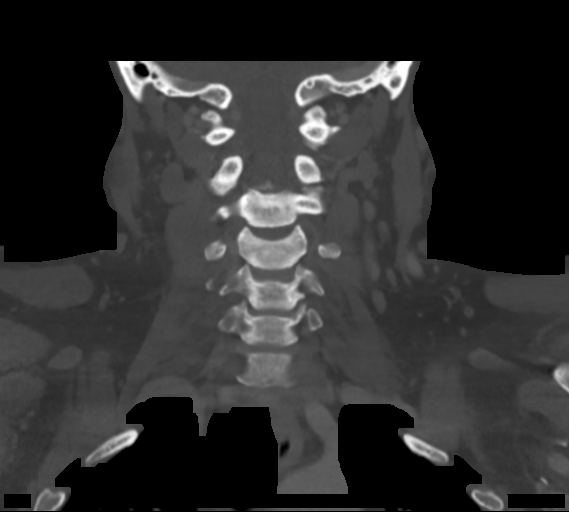

[16 of 33 positions shown; findings below may reference images not displayed]

FINDINGS: Pharynx and larynx: Soft palate is prominent and appears enlarged.
Soft palate appears to be touching the posterior nasopharynx and
difficult to separate the 2. No pharyngeal abscess. Epiglottis and
vocal cords normal.

Salivary glands: No inflammation, mass, or stone.

Thyroid: Negative

Lymph nodes: No enlarged lymph nodes in the neck.

Vascular: Normal vascular enhancement

Limited intracranial: Negative

Visualized orbits: Negative

Mastoids and visualized paranasal sinuses: Mild mucosal edema
paranasal sinuses.

Skeleton: Lucency in the right anterior maxilla appears to be
related to prior tooth extraction. This has irregular margins.
Possible chronic infection in the bone.

Upper chest: Negative

Other: None
IMPRESSION: Prominent soft palate which is touching the posterior nasopharynx.
It is difficult to separate the 2 structures. This could be related
to Valsalva. Possible inflammation of the soft palate. Tumor cannot
be excluded and direct visualization may be indicated.

No adenopathy in the neck

Extraction site for right upper bicuspid with persistent lucency in
the bone raising the possibility of chronic infection.

## 2023-02-26 ENCOUNTER — Other Ambulatory Visit: Payer: Self-pay | Admitting: Nurse Practitioner

## 2023-02-26 DIAGNOSIS — F17209 Nicotine dependence, unspecified, with unspecified nicotine-induced disorders: Secondary | ICD-10-CM

## 2023-03-30 ENCOUNTER — Encounter (HOSPITAL_COMMUNITY): Payer: Self-pay | Admitting: Internal Medicine

## 2023-03-30 ENCOUNTER — Other Ambulatory Visit: Payer: Self-pay

## 2023-03-30 ENCOUNTER — Emergency Department (HOSPITAL_COMMUNITY): Payer: Medicare HMO

## 2023-03-30 ENCOUNTER — Observation Stay (HOSPITAL_COMMUNITY)
Admission: EM | Admit: 2023-03-30 | Discharge: 2023-03-31 | Disposition: A | Discharge status: Home / Self Care | Payer: Medicare HMO | Attending: Internal Medicine | Admitting: Internal Medicine

## 2023-03-30 DIAGNOSIS — Z7982 Long term (current) use of aspirin: Secondary | ICD-10-CM | POA: Diagnosis not present

## 2023-03-30 DIAGNOSIS — K047 Periapical abscess without sinus: Secondary | ICD-10-CM | POA: Diagnosis not present

## 2023-03-30 DIAGNOSIS — M869 Osteomyelitis, unspecified: Secondary | ICD-10-CM

## 2023-03-30 DIAGNOSIS — Z79899 Other long term (current) drug therapy: Secondary | ICD-10-CM | POA: Diagnosis not present

## 2023-03-30 DIAGNOSIS — I10 Essential (primary) hypertension: Secondary | ICD-10-CM | POA: Insufficient documentation

## 2023-03-30 DIAGNOSIS — K0889 Other specified disorders of teeth and supporting structures: Secondary | ICD-10-CM | POA: Diagnosis present

## 2023-03-30 DIAGNOSIS — E785 Hyperlipidemia, unspecified: Secondary | ICD-10-CM | POA: Insufficient documentation

## 2023-03-30 DIAGNOSIS — I219 Acute myocardial infarction, unspecified: Secondary | ICD-10-CM | POA: Diagnosis not present

## 2023-03-30 DIAGNOSIS — K219 Gastro-esophageal reflux disease without esophagitis: Secondary | ICD-10-CM | POA: Insufficient documentation

## 2023-03-30 DIAGNOSIS — J449 Chronic obstructive pulmonary disease, unspecified: Secondary | ICD-10-CM | POA: Diagnosis not present

## 2023-03-30 LAB — CBC WITH DIFFERENTIAL/PLATELET
Abs Immature Granulocytes: 0.08 10*3/uL — ABNORMAL HIGH (ref 0.00–0.07)
Basophils Absolute: 0.1 10*3/uL (ref 0.0–0.1)
Basophils Relative: 1 %
Eosinophils Absolute: 0.8 10*3/uL — ABNORMAL HIGH (ref 0.0–0.5)
Eosinophils Relative: 6 %
HCT: 41.5 % (ref 39.0–52.0)
Hemoglobin: 13.5 g/dL (ref 13.0–17.0)
Immature Granulocytes: 1 %
Lymphocytes Relative: 19 %
Lymphs Abs: 2.5 10*3/uL (ref 0.7–4.0)
MCH: 28.8 pg (ref 26.0–34.0)
MCHC: 32.5 g/dL (ref 30.0–36.0)
MCV: 88.7 fL (ref 80.0–100.0)
Monocytes Absolute: 1 10*3/uL (ref 0.1–1.0)
Monocytes Relative: 8 %
Neutro Abs: 9.1 10*3/uL — ABNORMAL HIGH (ref 1.7–7.7)
Neutrophils Relative %: 65 %
Platelets: 287 10*3/uL (ref 150–400)
RBC: 4.68 MIL/uL (ref 4.22–5.81)
RDW: 15.6 % — ABNORMAL HIGH (ref 11.5–15.5)
WBC: 13.6 10*3/uL — ABNORMAL HIGH (ref 4.0–10.5)
nRBC: 0 % (ref 0.0–0.2)

## 2023-03-30 LAB — BASIC METABOLIC PANEL
Anion gap: 6 (ref 5–15)
BUN: 18 mg/dL (ref 8–23)
CO2: 24 mmol/L (ref 22–32)
Calcium: 9.4 mg/dL (ref 8.9–10.3)
Chloride: 108 mmol/L (ref 98–111)
Creatinine, Ser: 1.14 mg/dL (ref 0.61–1.24)
GFR, Estimated: >60 mL/min (ref >60)
Glucose, Bld: 96 mg/dL (ref 70–99)
Potassium: 3.8 mmol/L (ref 3.5–5.1)
Sodium: 138 mmol/L (ref 135–145)

## 2023-03-30 LAB — C-REACTIVE PROTEIN: CRP: 1.7 mg/dL — ABNORMAL HIGH (ref <1.0)

## 2023-03-30 LAB — SEDIMENTATION RATE: Sed Rate: 2 mm/hr (ref 0–16)

## 2023-03-30 MED ORDER — ENOXAPARIN SODIUM 40 MG/0.4ML IJ SOSY
40.0000 mg | PREFILLED_SYRINGE | INTRAMUSCULAR | Status: DC
  Administered 2023-03-30: 40 mg via SUBCUTANEOUS
  Filled 2023-03-30: qty 0.4

## 2023-03-30 MED ORDER — VANCOMYCIN HCL 2000 MG/400ML IV SOLN
2000.0000 mg | Freq: Once | INTRAVENOUS | Status: AC
  Administered 2023-03-30: 2000 mg via INTRAVENOUS
  Filled 2023-03-30: qty 400

## 2023-03-30 MED ORDER — SENNOSIDES-DOCUSATE SODIUM 8.6-50 MG PO TABS: 1.0000 | ORAL_TABLET | Freq: Every evening | ORAL | Status: DC | PRN

## 2023-03-30 MED ORDER — PANTOPRAZOLE SODIUM 40 MG PO TBEC
40.0000 mg | DELAYED_RELEASE_TABLET | Freq: Every day | ORAL | Status: DC
  Administered 2023-03-30 – 2023-03-31 (×2): 40 mg via ORAL
  Filled 2023-03-30 (×2): qty 1

## 2023-03-30 MED ORDER — MOMETASONE FURO-FORMOTEROL FUM 100-5 MCG/ACT IN AERO
2.0000 | INHALATION_SPRAY | Freq: Two times a day (BID) | RESPIRATORY_TRACT | Status: DC
  Administered 2023-03-30 – 2023-03-31 (×2): 2 via RESPIRATORY_TRACT
  Filled 2023-03-30: qty 8.8

## 2023-03-30 MED ORDER — POLYETHYLENE GLYCOL 3350 17 G PO PACK: 17.0000 g | PACK | Freq: Every day | ORAL | Status: DC | PRN

## 2023-03-30 MED ORDER — FENTANYL CITRATE PF 50 MCG/ML IJ SOSY
50.0000 ug | PREFILLED_SYRINGE | Freq: Once | INTRAMUSCULAR | Status: AC
  Administered 2023-03-30: 50 ug via INTRAVENOUS
  Filled 2023-03-30: qty 1

## 2023-03-30 MED ORDER — OXYCODONE HCL 5 MG PO TABS
5.0000 mg | ORAL_TABLET | Freq: Four times a day (QID) | ORAL | Status: DC | PRN
  Administered 2023-03-30: 5 mg via ORAL
  Filled 2023-03-30: qty 1

## 2023-03-30 MED ORDER — NICOTINE 7 MG/24HR TD PT24
7.0000 mg | MEDICATED_PATCH | Freq: Every day | TRANSDERMAL | Status: DC
  Administered 2023-03-30 – 2023-03-31 (×2): 7 mg via TRANSDERMAL
  Filled 2023-03-30 (×2): qty 1

## 2023-03-30 MED ORDER — AMLODIPINE BESYLATE 10 MG PO TABS
10.0000 mg | ORAL_TABLET | Freq: Every day | ORAL | Status: DC
  Administered 2023-03-31: 10 mg via ORAL
  Filled 2023-03-30: qty 1

## 2023-03-30 MED ORDER — IOHEXOL 350 MG/ML SOLN
75.0000 mL | Freq: Once | INTRAVENOUS | Status: AC | PRN
  Administered 2023-03-30: 75 mL via INTRAVENOUS

## 2023-03-30 MED ORDER — ACETAMINOPHEN 500 MG PO TABS
1000.0000 mg | ORAL_TABLET | Freq: Four times a day (QID) | ORAL | Status: DC
  Administered 2023-03-30 – 2023-03-31 (×4): 1000 mg via ORAL
  Filled 2023-03-30 (×4): qty 2

## 2023-03-30 MED ORDER — ASPIRIN 81 MG PO CHEW
81.0000 mg | CHEWABLE_TABLET | Freq: Every day | ORAL | Status: DC
  Administered 2023-03-31: 81 mg via ORAL
  Filled 2023-03-30: qty 1

## 2023-03-30 MED ORDER — VANCOMYCIN HCL 1500 MG/300ML IV SOLN: 1500.0000 mg | INTRAVENOUS | Status: DC

## 2023-03-30 MED ORDER — OXYCODONE-ACETAMINOPHEN 5-325 MG PO TABS
1.0000 | ORAL_TABLET | Freq: Once | ORAL | Status: AC
  Administered 2023-03-30: 1 via ORAL
  Filled 2023-03-30: qty 1

## 2023-03-30 MED ORDER — SODIUM CHLORIDE 0.9 % IV SOLN
3.0000 g | Freq: Four times a day (QID) | INTRAVENOUS | Status: DC
  Administered 2023-03-30 – 2023-03-31 (×5): 3 g via INTRAVENOUS
  Filled 2023-03-30 (×5): qty 8

## 2023-03-30 MED ORDER — ROSUVASTATIN CALCIUM 5 MG PO TABS
10.0000 mg | ORAL_TABLET | Freq: Every day | ORAL | Status: DC
  Administered 2023-03-31: 10 mg via ORAL
  Filled 2023-03-30: qty 2

## 2023-03-30 NOTE — H&P (Signed)
Date: 03/30/2023               Patient Name:  Isaac Contreras. MRN: [id number]  DOB: 12-Dec-1955 Age / Sex: 67 y.o., male   PCP: Maryagnes Amos, FNP         Medical Service: Internal Medicine Teaching Service         Attending Physician: Dr. Mercie Eon, MD    First Contact: Lajuana Ripple, MD      Pager: [phone]      Second Contact: Rudene Christians, DO      Pager: Milinda Pointer [phone]           After Hours (After 5p/  First Contact Pager: [phone]  weekends / holidays): Second Contact Pager: [phone]   SUBJECTIVE   Chief Complaint: mouth pain   History of Present Illness:  Isaac Contreras is a 67 year old male with a past medical history of hypertension, hyperlipidemia, prediabetes, chronic obstructive pulmonary disease, and persistent dental infection who presents with mouth pain.  Patient reports experiencing mouth pain for approximately one year. Describes the pain as dull and achy most of the time, but also intermittently sharp especially when biting. He has visited the dentist many times and underwent three tooth extraction procedures. Dentist has prescribed multiple courses of five different antibiotics. Most recently, he had a root canal procedure performed about one month ago. Despite these interventions, the pain has persisted. Decided to seek medical attention today given the persistence of his mouth pain. Associated symptoms include headache, which is directly related to the pain. Denies fever, chills, myalgias, sweats, nausea, vomiting, cough, congestion, rhinorrhea, diarrhea, and chest or abdominal pain. Although his pain is worsened with eating, the patient has maintained a normal diet without any limitations.    ED Course: Upon arrival to the ED, vitals notable for BP 174/102. Laboratory testing demonstrated WBC 13.6. Head CT revealed irregular lucency surrounding dental extraction site within anterior right maxilla concerning for possible  osteomyelitis, plus possible perioral phlegmon. Ampicillin-sulbactam, vancomycin, and oxycodone-acetaminophen were administered in the ED.   Meds:   Current Meds  Medication Sig   albuterol (PROVENTIL HFA;VENTOLIN HFA) 108 (90 Base) MCG/ACT inhaler Inhale 2 puffs into the lungs every 6 (six) hours as needed for wheezing.   amLODipine (NORVASC) 10 MG tablet Take 10 mg by mouth daily.   aspirin 81 MG tablet Take 81 mg by mouth daily.   budesonide-formoterol (SYMBICORT) 80-4.5 MCG/ACT inhaler Inhale 2 puffs into the lungs in the morning and at bedtime.   chlorhexidine (PERIDEX) 0.12 % solution Use as directed 15 mLs in the mouth or throat 4 (four) times daily.   clindamycin (CLEOCIN) 300 MG capsule Take 300 mg by mouth 4 (four) times daily.   glucose blood (ACCU-CHEK AVIVA) test strip Test glucose with fingerstick once daily.  Diag: E11.9 (Patient taking differently: 1 each by Other route See admin instructions. Test glucose with fingerstick once daily.  Diag: E11.9)   ibuprofen (ADVIL) 800 MG tablet Take 800 mg by mouth every 6 (six) hours as needed for moderate pain.   isosorbide mononitrate (IMDUR) 30 MG 24 hr tablet Take 30 mg by mouth daily.   Lancets (ACCU-CHEK SAFE-T PRO) lancets Test glucose with fingerstick once daily. Diag: E11.9 (Patient taking differently: 1 each by Other route See admin instructions. Test glucose with fingerstick once daily. Diag: E11.9)   Lancets MISC Check blood sugar three times daily. (Patient taking differently: 1 each by Other route See admin instructions. Check blood sugar  three times daily.)   omeprazole (PRILOSEC) 40 MG capsule Take 40 mg by mouth daily.   penicillin v potassium (VEETID) 500 MG tablet Take 500 mg by mouth 4 (four) times daily.   rosuvastatin (CRESTOR) 10 MG tablet Take 1 tablet (10 mg total) by mouth daily.   silver sulfADIAZINE (SILVADENE) 1 % cream Apply 1 Application topically 2 (two) times daily.   Amlodipine 10 Asa 81 Symbicort Imdur  doesn't take  Omeprazole 40 mg Crestor 10 mg    Past Medical History  Past Surgical History:  Procedure Laterality Date   APPENDECTOMY     COLONOSCOPY  09/07/2008   [employer], [place] tics, hems   cyst removed off back     I & D EXTREMITY Right 04/19/2020   Procedure: IRRIGATION AND DEBRIDEMENT EXTREMITY;  Surgeon: Dominica Severin, MD;  Location: [facility];  Service: Orthopedics;  Laterality: Right;     Social:  Lives With: wife at home Occupation: disabled Support: family Level of Function: independent in ADLs and IADLs PCP: [facility] Substances: about 0.5ppd cigarettes per day for 7yr, alcohol about once every month, denies cocaine or marijuana use    Family History:   Family History  Problem Relation Age of Onset   Cancer Mother    Colon cancer Neg Hx    Colon polyps Neg Hx    Esophageal cancer Neg Hx    Stomach cancer Neg Hx    Rectal cancer Neg Hx       Allergies:  Allergies as of 03/30/2023   (No Known Allergies)      Review of Systems: A complete ROS was negative except as per HPI.    OBJECTIVE:   Physical Exam: Blood pressure (!) 156/82, pulse 65, temperature 97.6 F (36.4 C), temperature source Oral, resp. rate 17, height 5\' 9"  (1.753 m), weight 108.9 kg, SpO2 97 %.   General:      awake and alert, lying in bed, cooperative, not in acute distress Skin:       warm and dry, intact without any obvious lesions or scars, no rashes Head:      normocephalic and atraumatic, oral mucosa moist with poor dentition, most teeth missing from right maxilla, gum erosion overlying yellowish-white structure consistent with bone Eyes:      extraocular movements intact, pupils round, no periorbital swelling or scleral icterus Lungs:      normal respiratory effort, breathing unlabored, symmetrical chest rise, no crackles or wheezing Cardiac:      regular rate and rhythm, normal S1 and S2, capillary refill ~1 second, no pitting edema Abdomen:      soft and  non-distended, normoactive bowel sounds, no tenderness to palpation or guarding Neurologic:      oriented to person-place-time, moving all extremities, no gross focal deficits Psychiatric:      euthymic mood with congruent affect, intelligible speech    Labs: CBC    Component Value Date/Time   WBC 13.6 (H) 03/30/2023 1137   RBC 4.68 03/30/2023 1137   HGB 13.5 03/30/2023 1137   HCT 41.5 03/30/2023 1137   PLT 287 03/30/2023 1137   MCV 88.7 03/30/2023 1137   MCH 28.8 03/30/2023 1137   MCHC 32.5 03/30/2023 1137   RDW 15.6 (H) 03/30/2023 1137   LYMPHSABS 2.5 03/30/2023 1137   MONOABS 1.0 03/30/2023 1137   EOSABS 0.8 (H) 03/30/2023 1137   BASOSABS 0.1 03/30/2023 1137     CMP     Component Value Date/Time   NA 138 03/30/2023  1137   K 3.8 03/30/2023 1137   CL 108 03/30/2023 1137   CO2 24 03/30/2023 1137   GLUCOSE 96 03/30/2023 1137   BUN 18 03/30/2023 1137   CREATININE 1.14 03/30/2023 1137   CREATININE 1.02 11/07/2015 1453   CALCIUM 9.4 03/30/2023 1137   PROT 7.0 11/07/2015 1453   ALBUMIN 4.4 11/07/2015 1453   AST 24 11/07/2015 1453   ALT 40 11/07/2015 1453   ALKPHOS 44 11/07/2015 1453   BILITOT 0.4 11/07/2015 1453   GFRNONAA >60 03/30/2023 1137   GFRAA 59 (L) 04/19/2020 1530     Imaging:  CT MAXILLOFACIAL W CONTRAST Result Date: 03/30/2023 IMPRESSION: 1. 1.4 x 1.0 x 1.2 cm irregular focus of lucency (with some peripheral hyperostosis) surrounding a dental extraction site within the anterior right maxilla. This may reflect chronic sequelae of prior dental disease and/or chronic osteomyelitis. Ill-defined hyperattenuation within the overlying perioral soft tissue suspicious for phlegmon. No soft tissue abscess is identified. 2. Mild, diffuse paranasal sinus mucosal thickening.     ECG: none    ASSESSMENT & PLAN:   Assessment & Plan by Problem: Principal Problem:   Dental infection   Isaac Curvin Haggan is a 67 year old male with a past medical history of  hypertension, hyperlipidemia, prediabetes, chronic obstructive pulmonary disease, and persistent dental infection who presents with mouth pain, now admitted for management of dental infection and possible osteomyelitis.   ---Mouth pain secondary to dental infection  ---Concern for osteomyelitis Patient has approximately one-year history of persistent dental infection despite three tooth extractions and multiple antibiotic courses via dentist. Recently underwent root canal procedure about one month ago. Successfully tolerates foods including solids. Presented on 5-28 with persistence of chronic mouth pain. Denies fever, chills, sweats, myalgias, and any new or acute worsening of symptoms. Upon arrival, laboratory testing demonstrated mild leukocytosis. Head CT revealed irregular lucency surrounding dental extraction site within anterior right maxilla, possibly concerning for osteomyelitis. Antibiotic regimen of ampicillin-sulbactam was initiated on admission. Blood cultures pending, plan to narrow antibiotic regimen based on speciation. If cultures are negative for growth, then maxillary biopsy may prove necessary to identify causative organism.   > Consult oral surgery on 5-29  > Ampicillin-sulbactam 3g q6  > Acetaminophen 1000mg  q6  > Oxycodone 5mg  q6 PRN  > Trend CBC q24  > Follow blood cultures, no growth day 0   ---Hypertension Patient has history of hypertension managed at home with amlodipine. Blood pressure has remained elevated since admission. If values remain elevated, then patient may benefit from another antihypertensive agent.  > Amlodipine 10mg  q24   ---Chronic obstructive pulmonary disease Patient has history of chronic obstructive pulmonary disease managed at home with budesonide-formoterol daily and albuterol as needed. Upon arrival, patient breathing comfortably without supplemental oxygen. Denies breath shortness, cough, chest pain, and any recent changes in respiratory status.  Mometasone-formoterol was initiated on admission.  > Mometasone-formoterol 100-5ug q12   ---Tobacco use Patient has history of tobacco use and currently smokes about 0.5ppd. Expressed interest in nicotine patches while hospitalized and is open to medication therapy for assistance with smoking cessation.   > Nicotine 7mg  q24  > Consider medication for cessation assistance upon discharge   ---Myocardial infarction  Patient has previously experienced a myocardial infarction in 1994. Home medications include aspirin, to which he reports regular adherence.   > Aspirin 81mg  q24   ---Hyperlipidemia Patient has history of hyperlipidemia managed at home with rosuvastatin, which was continued on admission.  > Rosuvastatin 10mg  q24   ---  Gastroesophageal reflux disease Patient has history of gastroesophageal reflux disease managed at home with pantoprazole.   > Pantoprazole 40mg  q24   ---Prediabetes Patient has self-reported history of prediabetes and currently denies taking any diabetes medications including insulin. Blood glucose has remained within the normal range throughout hospitalization.  > Trend BMP q24     Diet: Normal VTE: Enoxaparin IVF: None,None Code: Full  Prior to Admission Living Arrangement: Home, living with family Anticipated Discharge Location: Home Barriers to Discharge: medical management, diagnostic workup  Dispo: Admit patient to Observation with expected length of stay less than 2 midnights.  Signed: Crissie Sickles, MD Internal Medicine Resident PGY-1  03/30/2023, 4:51 PM

## 2023-03-30 NOTE — ED Triage Notes (Addendum)
Pt c/o ongoing dental pain and infection of R upper mouth x 1 year; several teeth removed; states infection and pain have continued; taking ibuprofen at home without relief; denies fevers

## 2023-03-30 NOTE — ED Notes (Signed)
Patient transported to CT 

## 2023-03-30 NOTE — Progress Notes (Signed)
Pharmacy Antibiotic Note  Isaac Contreras. is a 67 y.o. male for which pharmacy has been consulted for ampicillin-sulbactam and vancomycin dosing for anterior right maxilla osteomyelitis.  Patient with a history of asthma, DM, CAD, HTN, HLD. Patient presenting with right dental pain. Patient reports being on several abx over the past year and has had several teeth pulled in that time.  CT imaging with concern for anterior right maxilla (chronic sequelae of prior dental disease and/or chronic osteomyelitis). No abscess identified.  SCr 1.14 WBC 13.6; T 98.1; HR 70; RR 16  Plan: Unasyn 3g q6h Will provide MRSA coverage with vancomycin initially as degree instrumentation and procedural history of prior dental issues is currently unknown. Vancomycin 2000 mg once then 1500 mg q24hr (eAUC 474.8) unless change in renal function Trend WBC, Fever, Renal function F/u cultures, clinical course, WBC De-escalate when able Levels at steady state  Height: 5\' 9"  (175.3 cm) Weight: 108.9 kg (240 lb) IBW/kg (Calculated) : 70.7  Temp (24hrs), Avg:98.1 F (36.7 C), Min:98.1 F (36.7 C), Max:98.1 F (36.7 C)  Recent Labs  Lab 03/30/23 1137  WBC 13.6*  CREATININE 1.14    Estimated Creatinine Clearance: 78.6 mL/min (by C-G formula based on SCr of 1.14 mg/dL).    No Known Allergies  Microbiology results: Pending  Thank you for allowing pharmacy to be a part of this patient's care.  Delmar Landau, PharmD, BCPS 03/30/2023 1:25 PM ED Clinical Pharmacist -  [phone]

## 2023-03-30 NOTE — ED Notes (Signed)
ED TO INPATIENT HANDOFF REPORT  ED Nurse Name and Phone #: Jess Barters [phone]  S Name/Age/Gender Isaac Contreras. 67 y.o. male Room/Bed: 007C/007C  Code Status   Code Status: Full Code  Home/SNF/Other Home Patient oriented to: self, place, time, and situation Is this baseline? Yes   Triage Complete: Triage complete  Chief Complaint Dental infection [K04.7]  Triage Note Pt c/o ongoing dental pain and infection of R upper mouth x 1 year; several teeth removed; states infection and pain have continued; taking ibuprofen at home without relief; denies fevers   Allergies No Known Allergies  Level of Care/Admitting Diagnosis ED Disposition     ED Disposition  Admit   Condition  --   Comment  Hospital Area: [facility] [[id number]]  Level of Care: Med-Surg [16]  May place patient in observation at [facility] or [facility] if equivalent level of care is available:: No  Covid Evaluation: Asymptomatic - no recent exposure (last 10 days) testing not required  Diagnosis: Dental infection [454098]  Admitting Physician: Mercie Eon [[id number]]  Attending Physician: Mercie Eon [[id number]]          B Medical/Surgery History Past Medical History:  Diagnosis Date   Acute MI (HCC) 11/02/1992   reports prior MI in [place]   Arthritis    Asthma    Bronchitis    Diabetes (HCC)    HLD (hyperlipidemia)    Hx of adenomatous colonic polyps 02/22/2016   Hypertension    Tobacco abuse    Past Surgical History:  Procedure Laterality Date   APPENDECTOMY     COLONOSCOPY  09/07/2008   [place], [place] tics, hems   cyst removed off back     I & D EXTREMITY Right 04/19/2020   Procedure: IRRIGATION AND DEBRIDEMENT EXTREMITY;  Surgeon: Dominica Severin, MD;  Location: [facility] OR;  Service: Orthopedics;  Laterality: Right;     A IV Location/Drains/Wounds Patient Lines/Drains/Airways Status     Active Line/Drains/Airways     Name Placement date Placement time Site  Days   Peripheral IV 03/30/23 20 G Left Antecubital 03/30/23  1136  Antecubital  less than 1   Incision (Closed) 04/19/20 Hand Right 04/19/20  1905  -- 1075            Intake/Output Last 24 hours No intake or output data in the 24 hours ending 03/30/23 1438  Labs/Imaging Results for orders placed or performed during the hospital encounter of 03/30/23 (from the past 48 hour(s))  CBC with Differential     Status: Abnormal   Collection Time: 03/30/23 11:37 AM  Result Value Ref Range   WBC 13.6 (H) 4.0 - 10.5 K/uL   RBC 4.68 4.22 - 5.81 MIL/uL   Hemoglobin 13.5 13.0 - 17.0 g/dL   HCT 08.6 57.8 - 46.9 %   MCV 88.7 80.0 - 100.0 fL   MCH 28.8 26.0 - 34.0 pg   MCHC 32.5 30.0 - 36.0 g/dL   RDW 62.9 (H) 52.8 - 41.3 %   Platelets 287 150 - 400 K/uL   nRBC 0.0 0.0 - 0.2 %   Neutrophils Relative % 65 %   Neutro Abs 9.1 (H) 1.7 - 7.7 K/uL   Lymphocytes Relative 19 %   Lymphs Abs 2.5 0.7 - 4.0 K/uL   Monocytes Relative 8 %   Monocytes Absolute 1.0 0.1 - 1.0 K/uL   Eosinophils Relative 6 %   Eosinophils Absolute 0.8 (H) 0.0 - 0.5 K/uL   Basophils Relative  1 %   Basophils Absolute 0.1 0.0 - 0.1 K/uL   Immature Granulocytes 1 %   Abs Immature Granulocytes 0.08 (H) 0.00 - 0.07 K/uL    Comment: Performed at [facility], [address], [place], [place] [address]  Basic metabolic panel     Status: None   Collection Time: 03/30/23 11:37 AM  Result Value Ref Range   Sodium 138 135 - 145 mmol/L   Potassium 3.8 3.5 - 5.1 mmol/L   Chloride 108 98 - 111 mmol/L   CO2 24 22 - 32 mmol/L   Glucose, Bld 96 70 - 99 mg/dL    Comment: Glucose reference range applies only to samples taken after fasting for at least 8 hours.   BUN 18 8 - 23 mg/dL   Creatinine, Ser 5.36 0.61 - 1.24 mg/dL   Calcium 9.4 8.9 - 64.4 mg/dL   GFR, Estimated >03 >47 mL/min    Comment: (NOTE) Calculated using the CKD-EPI Creatinine Equation (2021)    Anion gap 6 5 - 15    Comment: Performed at [facility]  [facility], [address] [place], [place] [address]   CT MAXILLOFACIAL W CONTRAST  Result Date: 03/30/2023 CLINICAL DATA:  Right history: Sinusitis, chronic or recurrent. Worsening right maxillary sinus pain, from upper right teeth. Concern for sinus infection or mass. The patient reports having three teeth pulled, recent antibiotics. EXAM: CT MAXILLOFACIAL WITH CONTRAST TECHNIQUE: Multidetector CT imaging of the maxillofacial structures was performed with intravenous contrast. Multiplanar CT image reconstructions were also generated. RADIATION DOSE REDUCTION: This exam was performed according to the departmental dose-optimization program which includes automated exposure control, adjustment of the mA and/or kV according to patient size and/or use of iterative reconstruction technique. CONTRAST:  75mL OMNIPAQUE IOHEXOL 350 MG/ML SOLN COMPARISON:  Neck CT 03/14/2020. FINDINGS: Osseous: Poor dentition with multiple absent teeth. 1.4 x 1.0 x 1.2 cm focus of irregular lucency (with some peripheral hyperostosis) surrounding a dental extraction site within the anterior right maxilla (for instance as seen on series 6, image 50) (series 9, image 23). Ill-defined hyperattenuation within the overlying perioral soft tissues suspicious for phlegmon. Orbits: No acute finding within the orbits. Sinuses: Mild, diffuse mucosal thickening within the paranasal sinuses. Soft tissues: Ill-defined hyperattenuation within the right perioral soft tissues, as described above. No soft tissue abscess is identified. Limited intracranial: No evidence of an acute intracranial abnormality within the field of view. IMPRESSION: 1. 1.4 x 1.0 x 1.2 cm irregular focus of lucency (with some peripheral hyperostosis) surrounding a dental extraction site within the anterior right maxilla. This may reflect chronic sequelae of prior dental disease and/or chronic osteomyelitis. Ill-defined hyperattenuation within the overlying perioral soft tissue suspicious for  phlegmon. No soft tissue abscess is identified. 2. Mild, diffuse paranasal sinus mucosal thickening. Electronically Signed   By: Jackey Loge D.O.   On: 03/30/2023 12:42    Pending Labs Unresulted Labs (From admission, onward)     Start     Ordered   03/31/23 0500  HIV Antibody (routine testing w rflx)  (HIV Antibody (Routine testing w reflex) panel)  Tomorrow morning,   R        03/30/23 1411   03/31/23 0500  Basic metabolic panel  Tomorrow morning,   R        03/30/23 1411   03/31/23 0500  CBC  Tomorrow morning,   R        03/30/23 1411  Vitals/Pain Today's Vitals   03/30/23 1136 03/30/23 1243 03/30/23 1243 03/30/23 1426  BP:      Pulse:      Resp:      Temp:    98.1 F (36.7 C)  TempSrc:    Oral  SpO2:      Weight:      Height:      PainSc: 10-Worst pain ever 10-Worst pain ever 10-Worst pain ever     Isolation Precautions No active isolations  Medications Medications  Ampicillin-Sulbactam (UNASYN) 3 g in sodium chloride 0.9 % 100 mL IVPB (3 g Intravenous New Bag/Given 03/30/23 1415)  vancomycin (VANCOREADY) IVPB 2000 mg/400 mL (0 mg Intravenous Hold 03/30/23 1417)    Followed by  vancomycin (VANCOREADY) IVPB 1500 mg/300 mL (has no administration in time range)  enoxaparin (LOVENOX) injection 40 mg (has no administration in time range)  oxyCODONE-acetaminophen (PERCOCET/ROXICET) 5-325 MG per tablet 1 tablet (1 tablet Oral Given 03/30/23 1131)  iohexol (OMNIPAQUE) 350 MG/ML injection 75 mL (75 mLs Intravenous Contrast Given 03/30/23 1200)  fentaNYL (SUBLIMAZE) injection 50 mcg (50 mcg Intravenous Given 03/30/23 1412)    Mobility walks     Focused Assessments    R Recommendations: See Admitting Provider Note  Report given to:   Additional Notes:

## 2023-03-30 NOTE — ED Provider Notes (Addendum)
[name] AT [facility] Provider Note   CSN: [id number] Arrival date & time: 03/30/23  8295     History  Chief Complaint  Patient presents with   Dental Pain    Isaac Shiers. is a 67 y.o. male.  The history is provided by the patient and medical records. No language interpreter was used.  Dental Pain Location:  Upper Upper teeth location: upper right gum line and sinus. Quality:  Aching and dull Severity:  Severe Onset quality:  Gradual Duration:  9 months Timing:  Constant Progression:  Worsening Chronicity:  Chronic Relieved by:  Nothing Worsened by:  Nothing Ineffective treatments:  None tried Associated symptoms: facial pain   Associated symptoms: no congestion, no fever, no headaches and no neck pain        Home Medications Prior to Admission medications   Medication Sig Start Date End Date Taking? Authorizing Provider  albuterol (PROVENTIL HFA;VENTOLIN HFA) 108 (90 Base) MCG/ACT inhaler Inhale 2 puffs into the lungs every 6 (six) hours as needed for wheezing. 11/21/15   Wallis Bamberg, PA-C  amLODipine (NORVASC) 10 MG tablet Take 10 mg by mouth daily.    [provider]  aspirin 81 MG tablet Take 81 mg by mouth daily.    [provider]  budesonide-formoterol (SYMBICORT) 80-4.5 MCG/ACT inhaler Inhale 2 puffs into the lungs in the morning and at bedtime. 10/05/21   Raspet, Noberto Retort, PA-C  doxycycline (VIBRAMYCIN) 100 MG capsule Take 1 capsule (100 mg total) by mouth 2 (two) times daily. 10/05/21   Raspet, Erin K, PA-C  glucose blood (ACCU-CHEK AVIVA) test strip Test glucose with fingerstick once daily.  Diag: E11.9 05/21/16   Wallis Bamberg, PA-C  isosorbide mononitrate (IMDUR) 30 MG 24 hr tablet Take 30 mg by mouth daily. 12/28/19   [provider]  Lancets (ACCU-CHEK SAFE-T PRO) lancets Test glucose with fingerstick once daily. Diag: E11.9 05/21/16   Wallis Bamberg, PA-C  Lancets MISC Check blood sugar three  times daily. 01/16/16   Wallis Bamberg, PA-C  omeprazole (PRILOSEC) 20 MG capsule Take 1 capsule (20 mg total) by mouth 2 (two) times daily before a meal for 14 days. 03/07/20 03/21/20  Wieters, Hallie C, PA-C  rosuvastatin (CRESTOR) 10 MG tablet Take 1 tablet (10 mg total) by mouth daily. 03/14/20   Couture, Cortni S, PA-C      Allergies    Patient has no known allergies.    Review of Systems   Review of Systems  Constitutional:  Negative for chills, fatigue and fever.  HENT:  Positive for dental problem, sinus pressure and sinus pain. Negative for congestion.   Eyes:  Negative for pain and visual disturbance.  Respiratory:  Negative for cough, chest tightness, shortness of breath and wheezing.   Cardiovascular:  Negative for chest pain and palpitations.  Gastrointestinal:  Negative for abdominal pain, constipation, diarrhea and nausea.  Genitourinary:  Negative for dysuria and frequency.  Musculoskeletal:  Negative for back pain, neck pain and neck stiffness.  Skin:  Negative for rash and wound.  Neurological:  Negative for dizziness, weakness, light-headedness, numbness and headaches.  Psychiatric/Behavioral:  Negative for agitation and confusion.   All other systems reviewed and are negative.   Physical Exam Updated Vital Signs BP (!) 174/102   Pulse 78   Temp 98.1 F (36.7 C) (Oral)   Resp 18   Ht 5\' 9"  (1.753 m)   Wt 108.9 kg   SpO2 98%  BMI 35.44 kg/m  Physical Exam Vitals and nursing note reviewed.  Constitutional:      General: He is not in acute distress.    Appearance: He is well-developed. He is not ill-appearing, toxic-appearing or diaphoretic.  HENT:     Head: Atraumatic.      Nose: Nose normal. No congestion or rhinorrhea.     Mouth/Throat:     Mouth: Mucous membranes are moist.     Dentition: Abnormal dentition. Dental tenderness present.     Pharynx: Oropharynx is clear. Uvula midline. No oropharyngeal exudate or posterior oropharyngeal erythema.      Tonsils: No tonsillar exudate or tonsillar abscesses.     Comments: Patient has had multiple teeth pulled in the right upper mouth.  Patient is tender but there is no erythema or purulence seen.  Patient has tenderness in right sinus.  Nasal exam unremarkable and posterior oropharynx denies evidence of PTA or RPA.  Tenderness present. Eyes:     Conjunctiva/sclera: Conjunctivae normal.     Pupils: Pupils are equal, round, and reactive to light.  Cardiovascular:     Rate and Rhythm: Normal rate and regular rhythm.     Heart sounds: No murmur heard. Pulmonary:     Effort: Pulmonary effort is normal. No respiratory distress.     Breath sounds: Normal breath sounds. No wheezing, rhonchi or rales.  Chest:     Chest wall: No tenderness.  Abdominal:     General: Abdomen is flat.     Palpations: Abdomen is soft.     Tenderness: There is no abdominal tenderness. There is no right CVA tenderness, left CVA tenderness, guarding or rebound.  Musculoskeletal:        General: Tenderness present. No swelling.     Cervical back: Neck supple.     Right lower leg: No edema.     Left lower leg: No edema.  Skin:    General: Skin is warm and dry.     Capillary Refill: Capillary refill takes less than 2 seconds.     Findings: No erythema or rash.  Neurological:     General: No focal deficit present.     Mental Status: He is alert.     Sensory: No sensory deficit.     Motor: No weakness.  Psychiatric:        Mood and Affect: Mood normal.     ED Results / Procedures / Treatments   Labs (all labs ordered are listed, but only abnormal results are displayed) Labs Reviewed  CBC WITH DIFFERENTIAL/PLATELET - Abnormal; Notable for the following components:      Result Value   WBC 13.6 (*)    RDW 15.6 (*)    Neutro Abs 9.1 (*)    Eosinophils Absolute 0.8 (*)    Abs Immature Granulocytes 0.08 (*)    All other components within normal limits  BASIC METABOLIC PANEL    EKG None  Radiology CT  MAXILLOFACIAL W CONTRAST  Result Date: 03/30/2023 CLINICAL DATA:  Right history: Sinusitis, chronic or recurrent. Worsening right maxillary sinus pain, from upper right teeth. Concern for sinus infection or mass. The patient reports having three teeth pulled, recent antibiotics. EXAM: CT MAXILLOFACIAL WITH CONTRAST TECHNIQUE: Multidetector CT imaging of the maxillofacial structures was performed with intravenous contrast. Multiplanar CT image reconstructions were also generated. RADIATION DOSE REDUCTION: This exam was performed according to the departmental dose-optimization program which includes automated exposure control, adjustment of the mA and/or kV according to patient size and/or use  of iterative reconstruction technique. CONTRAST:  75mL OMNIPAQUE IOHEXOL 350 MG/ML SOLN COMPARISON:  Neck CT 03/14/2020. FINDINGS: Osseous: Poor dentition with multiple absent teeth. 1.4 x 1.0 x 1.2 cm focus of irregular lucency (with some peripheral hyperostosis) surrounding a dental extraction site within the anterior right maxilla (for instance as seen on series 6, image 50) (series 9, image 23). Ill-defined hyperattenuation within the overlying perioral soft tissues suspicious for phlegmon. Orbits: No acute finding within the orbits. Sinuses: Mild, diffuse mucosal thickening within the paranasal sinuses. Soft tissues: Ill-defined hyperattenuation within the right perioral soft tissues, as described above. No soft tissue abscess is identified. Limited intracranial: No evidence of an acute intracranial abnormality within the field of view. IMPRESSION: 1. 1.4 x 1.0 x 1.2 cm irregular focus of lucency (with some peripheral hyperostosis) surrounding a dental extraction site within the anterior right maxilla. This may reflect chronic sequelae of prior dental disease and/or chronic osteomyelitis. Ill-defined hyperattenuation within the overlying perioral soft tissue suspicious for phlegmon. No soft tissue abscess is identified.  2. Mild, diffuse paranasal sinus mucosal thickening. Electronically Signed   By: Jackey Loge D.O.   On: 03/30/2023 12:42    Procedures Procedures   CRITICAL CARE Performed by: Canary Brim Lakasha Mcfall Total critical care time: 35 minutes Critical care time was exclusive of separately billable procedures and treating other patients. Critical care was necessary to treat or prevent imminent or life-threatening deterioration. Critical care was time spent personally by me on the following activities: development of treatment plan with patient and/or surrogate as well as nursing, discussions with consultants, evaluation of patient's response to treatment, examination of patient, obtaining history from patient or surrogate, ordering and performing treatments and interventions, ordering and review of laboratory studies, ordering and review of radiographic studies, pulse oximetry and re-evaluation of patient's condition.   Medications Ordered in ED Medications  Ampicillin-Sulbactam (UNASYN) 3 g in sodium chloride 0.9 % 100 mL IVPB (0 g Intravenous Stopped 03/30/23 1452)  vancomycin (VANCOREADY) IVPB 2000 mg/400 mL (2,000 mg Intravenous New Bag/Given 03/30/23 1452)    Followed by  vancomycin (VANCOREADY) IVPB 1500 mg/300 mL (has no administration in time range)  enoxaparin (LOVENOX) injection 40 mg (40 mg Subcutaneous Given 03/30/23 1439)  oxyCODONE-acetaminophen (PERCOCET/ROXICET) 5-325 MG per tablet 1 tablet (1 tablet Oral Given 03/30/23 1131)  iohexol (OMNIPAQUE) 350 MG/ML injection 75 mL (75 mLs Intravenous Contrast Given 03/30/23 1200)  fentaNYL (SUBLIMAZE) injection 50 mcg (50 mcg Intravenous Given 03/30/23 1412)    ED Course/ Medical Decision Making/ A&P                             Medical Decision Making Amount and/or Complexity of Data Reviewed Labs: ordered. Radiology: ordered.  Risk Prescription drug management. Decision regarding hospitalization.    Isaac Alfaro. is a 67  y.o. male with a past medical history significant for asthma, diabetes, CAD with previous MI, hypertension, hyperlipidemia, and previous dental infection status post teeth being pulled who presents with right dental and sinus pain.  According to patient, for the last year or so he has had trouble with his right upper jaw and is at 3 teeth pulled.  He has been on about 5 different antibiotics on and off he reports to help with what he thought was dental infections but recently has been worsening and not getting better.  He said the pain is going into his right cheek and sinus under his eye and  is severe.  He denies fevers chills or cough and denies medications helping his pain.  He reports it is severe.  He reports no vision changes or difficulty moving his eyes around.  He denies any focal trauma.  Denies any other headache neck pain or neck stiffness.  Is not able to get difficulty.  He denies any nasal symptoms at this time and otherwise well-appearing.  He said that as the dentists have not been helping him he decided to come here instead.  On exam, lungs clear and chest nontender.  Abdomen nontender.  Neck was nontender.  Lower jaw nontender.  Right maxillary sinus and right upper jaw was quite tender to palpation.  He did not have any difficulty with extraocular movements and there was no evidence of entrapment.  Nasal exam unremarkable but he is exquisitely tender over his right sinus.  Pupils symmetric and reactive.  Seeing multiple teeth in his right upper jaw from the extractions.  Do not see evidence of external dental initially. exam otherwise unremarkable.  Clinically suspect patient either has a smoldering dental infection versus a sinusitis that has not been taking care of with the teeth being pulled or antibiotics.  As patient is having 10 out of pain and is quite tender I did feel that given weeks and months of worsening pain that is reasonable to get an image to look for large sinusitis, some of  the large mass, or other acute problem.  Will get a CT scan without contrast of the maxillofacial area and will get some basic labs.  Will give him a pain pill.  Anticipate reassessment after workup.  12:53 PM Patient does have leukocytosis.  Metabolic panel reassuring.  CT returned showing evidence of dental infection versus osteomyelitis.  There is also overlying phlegmon but no large abscess for drainage at this time.  Will call dentistry to discuss if he needs IV antibiotics and admission or not.  1:17 PM Spoke to dentistry that felt as he did not have teeth in this location it is unlikely to be able to help.  They felt oral surgery might be more beneficial however there is not oral surgeon coverage on-call.  They agree that given his severe pain, leukocytosis, and failure of outpatient multiple antibiotics, it is reasonable for have him admission for IV antibiotics and further evaluation/management.  Will call for admission and speak to pharmacy about best antibiotic coverage.        Final Clinical Impression(s) / ED Diagnoses Final diagnoses:  Osteomyelitis, unspecified site, unspecified type (HCC)    Clinical Impression: 1. Osteomyelitis, unspecified site, unspecified type [facility])     Disposition: Admit  This note was prepared with assistance of Dragon voice recognition software. Occasional wrong-word or sound-a-like substitutions may have occurred due to the inherent limitations of voice recognition software.     Shelby Anderle, Canary Brim, MD 03/30/23 1645    Aman Batley, Canary Brim, MD 03/30/23 [phone]

## 2023-03-30 NOTE — Hospital Course (Addendum)
Diabetic  Over last year worsening pain in month  3 teeth pulled  5 abx  OM in   He's been on abx since 2023. He finished course and   Root canal 48/24  Something felt off  Is able to eat but has a lot of pain. No fevers or chills  Has headache at times from tooth pain  [facility] Pap [address]  HTN Pre-DM  Meds:  Amlodipine 10 Asa 81 Symbicort Imdur doesn't take  Omeprazole 40 mg Crestor 10 mg  5/29: Patient has been in and out of ED with dental issues for a while and has had several extractions. He came in this time bcause of facial pain that was worse than usual, which started once they removed the second tooth about a year ago. This was done at [facility]. He thinks there was some residual infection around the second tooth that spread to another one that was adjacent, which they removed about a month ago. He has had bad pain in the area of the removed teeth for about a month which got worse over the last couple of days to the point that he couldn't sleep. Pain is better now with meds but is still about 7-8/10.

## 2023-03-31 DIAGNOSIS — M8618 Other acute osteomyelitis, other site: Secondary | ICD-10-CM

## 2023-03-31 DIAGNOSIS — I10 Essential (primary) hypertension: Secondary | ICD-10-CM | POA: Diagnosis not present

## 2023-03-31 DIAGNOSIS — M869 Osteomyelitis, unspecified: Secondary | ICD-10-CM

## 2023-03-31 DIAGNOSIS — F17210 Nicotine dependence, cigarettes, uncomplicated: Secondary | ICD-10-CM

## 2023-03-31 DIAGNOSIS — K047 Periapical abscess without sinus: Secondary | ICD-10-CM | POA: Diagnosis not present

## 2023-03-31 DIAGNOSIS — J449 Chronic obstructive pulmonary disease, unspecified: Secondary | ICD-10-CM

## 2023-03-31 LAB — BASIC METABOLIC PANEL
Anion gap: 9 (ref 5–15)
BUN: 18 mg/dL (ref 8–23)
CO2: 20 mmol/L — ABNORMAL LOW (ref 22–32)
Calcium: 8.7 mg/dL — ABNORMAL LOW (ref 8.9–10.3)
Chloride: 108 mmol/L (ref 98–111)
Creatinine, Ser: 0.97 mg/dL (ref 0.61–1.24)
GFR, Estimated: >60 mL/min (ref >60)
Glucose, Bld: 132 mg/dL — ABNORMAL HIGH (ref 70–99)
Potassium: 3.8 mmol/L (ref 3.5–5.1)
Sodium: 137 mmol/L (ref 135–145)

## 2023-03-31 LAB — CBC
HCT: 37.2 % — ABNORMAL LOW (ref 39.0–52.0)
Hemoglobin: 12.5 g/dL — ABNORMAL LOW (ref 13.0–17.0)
MCH: 29.4 pg (ref 26.0–34.0)
MCHC: 33.6 g/dL (ref 30.0–36.0)
MCV: 87.5 fL (ref 80.0–100.0)
Platelets: 265 10*3/uL (ref 150–400)
RBC: 4.25 MIL/uL (ref 4.22–5.81)
RDW: 15.6 % — ABNORMAL HIGH (ref 11.5–15.5)
WBC: 10.7 10*3/uL — ABNORMAL HIGH (ref 4.0–10.5)
nRBC: 0 % (ref 0.0–0.2)

## 2023-03-31 LAB — HIV ANTIBODY (ROUTINE TESTING W REFLEX): HIV Screen 4th Generation wRfx: NONREACTIVE

## 2023-03-31 MED ORDER — ORAL CARE MOUTH RINSE: 15.0000 mL | OROMUCOSAL | Status: DC | PRN

## 2023-03-31 MED ORDER — OXYCODONE HCL 5 MG PO TABS: 5.0000 mg | ORAL_TABLET | Freq: Four times a day (QID) | ORAL | 0 refills | Status: AC | PRN

## 2023-03-31 MED ORDER — NICOTINE 7 MG/24HR TD PT24: 7.0000 mg | MEDICATED_PATCH | Freq: Every day | TRANSDERMAL | 0 refills | Status: AC

## 2023-03-31 MED ORDER — AMOXICILLIN-POT CLAVULANATE 500-125 MG PO TABS: 1.0000 | ORAL_TABLET | Freq: Three times a day (TID) | ORAL | 0 refills | Status: AC

## 2023-03-31 MED ORDER — RIVAROXABAN 10 MG PO TABS
10.0000 mg | ORAL_TABLET | Freq: Every day | ORAL | Status: DC
  Administered 2023-03-31: 10 mg via ORAL
  Filled 2023-03-31: qty 1

## 2023-03-31 NOTE — Progress Notes (Signed)
Chap responded to request for AD. PT stated that he wants to make his wife his HCPOA.  PT states both he and his wife are familiar with the AD and they don't necessarily need the full education. Chap provided abbreviated education to PT and made sure he understood that they can call us to come back if they have questions and need help.   03/31/23 1448  Spiritual Encounters  Type of Visit Initial  Care provided to: Patient  Conversation partners present during encounter Nurse  Referral source Patient request  Reason for visit Advance directives  OnCall Visit No

## 2023-03-31 NOTE — Discharge Instructions (Signed)
[name],  It was a pleasure taking care of you while you were in the hospital. Your mouth pain is caused by an infection that has persisted despite multiple tooth extractions and antibiotic regimens. We gave you a strong antibiotic called Augmentin that you will continue to take at home for the next six weeks. This antibiotic will help treat your infection, but you may ultimately need to have an orofacial surgeon remove the infected bone. Unfortunately, [facility] cannot provide this service for you but here are a few local options:     - [facility] [phone])    - [facility] [phone])    - [facility] [phone])  We have scheduled a follow-up visit with our infectious disease team for 04-08-2023 at 9:30am. It is important that you continue to take the Augmentin daily and meet with our infectious disease team. Additionally, we are sending you home with some oxycodone for pain relief.  Please return to the emergency department if your pain worsens significantly or if you develop fever, chills, and weakness.

## 2023-03-31 NOTE — Discharge Summary (Signed)
Name: Isaac Contreras. MRN: [id number] DOB: 06/22/1956 67 y.o. PCP: Maryagnes Amos, FNP  Date of Admission: 03/30/2023  9:59 AM Date of Discharge: 03-31-2023 Attending Physician: Mercie Eon, MD  Discharge Diagnosis: Principal Problem:   Dental infection Active Problems:   Osteomyelitis [facility])     Discharge Medications: Allergies as of 03/31/2023   No Known Allergies      Medication List     STOP taking these medications    clindamycin 300 MG capsule Commonly known as: CLEOCIN   doxycycline 100 MG capsule Commonly known as: VIBRAMYCIN   penicillin v potassium 500 MG tablet Commonly known as: VEETID   silver sulfADIAZINE 1 % cream Commonly known as: SILVADENE       TAKE these medications    albuterol 108 (90 Base) MCG/ACT inhaler Commonly known as: VENTOLIN HFA Inhale 2 puffs into the lungs every 6 (six) hours as needed for wheezing.   amLODipine 10 MG tablet Commonly known as: NORVASC Take 10 mg by mouth daily.   amoxicillin-clavulanate 500-125 MG tablet Commonly known as: Augmentin Take 1 tablet by mouth 3 (three) times daily.   aspirin 81 MG tablet Take 81 mg by mouth daily.   budesonide-formoterol 80-4.5 MCG/ACT inhaler Commonly known as: Symbicort Inhale 2 puffs into the lungs in the morning and at bedtime.   chlorhexidine 0.12 % solution Commonly known as: PERIDEX Use as directed 15 mLs in the mouth or throat 4 (four) times daily.   glucose blood test strip Commonly known as: Accu-Chek Aviva Test glucose with fingerstick once daily.  Diag: E11.9 What changed:  how much to take how to take this when to take this   ibuprofen 800 MG tablet Commonly known as: ADVIL Take 800 mg by mouth every 6 (six) hours as needed for moderate pain.   isosorbide mononitrate 30 MG 24 hr tablet Commonly known as: IMDUR Take 30 mg by mouth daily.   Lancets Misc Check blood sugar three times daily. What changed:  how much to take how  to take this when to take this   accu-chek safe-t pro lancets Test glucose with fingerstick once daily. Diag: E11.9 What changed:  how much to take how to take this when to take this   nicotine 7 mg/24hr patch Commonly known as: NICODERM CQ - dosed in mg/24 hr Place 1 patch (7 mg total) onto the skin daily. Start taking on: Apr 01, 2023   omeprazole 40 MG capsule Commonly known as: PRILOSEC Take 40 mg by mouth daily.   oxyCODONE 5 MG immediate release tablet Commonly known as: Oxy IR/ROXICODONE Take 1 tablet (5 mg total) by mouth every 6 (six) hours as needed for up to 5 days for moderate pain.   rosuvastatin 10 MG tablet Commonly known as: Crestor Take 1 tablet (10 mg total) by mouth daily.         Disposition and follow-up:    [name]. was discharged from [facility] in Stable condition.  At the hospital follow up visit please address:   1.  Dental infection: inquire about symptoms including pain and fever, evaluate infected tissue and consider CBC if indicated, confirm adherence to antibiotic regimen of amoxicillin-clavulanate last dose 7-10, ensure care establishment with orofacial surgery for specialist evaluation  2.  Hypertension: inquire about symptoms including lightheadedness and blurry vision, check blood pressure, confirm adherence to amlodipine and adjust regimen as indicated  3.  Tobacco use: inquire about current tobacco use, encourage  cessation and provide resources including medications as appropriate   4.  Labs / imaging needed at time of follow-up: consider CBC  5.  Pending labs / tests needing follow-up: none    Follow-up Appointments:  [facility] on 04-08-2023 at 9:30am with Dr Danelle Earthly  Please schedule a hospital follow-up with your primary care provider within 1-2 weeks  It is important that you are evaluated by an orofacial surgeon, here are some local options:  - [facility] [phone]) - [facility] [phone]) - [facility] [phone])    Hospital Course by problem list:  [name] is a 67 year old male with a past medical history of hypertension, hyperlipidemia, prediabetes, chronic obstructive pulmonary disease, and persistent dental infection who presents with mouth pain, now admitted for management of dental infection and possible osteomyelitis.     ---Mouth pain secondary to dental infection  ---Concern for osteomyelitis Patient has approximately one-year history of persistent dental infection despite three tooth extractions and multiple antibiotic courses via dentist. Recently underwent root canal procedure about one month ago. Successfully tolerates foods including solids. Presented on 5-28 with persistence of chronic mouth pain. Denies fever, chills, sweats, myalgias, and lightheadedness. Upon arrival, laboratory testing demonstrated mild leukocytosis. Head CT revealed irregular lucency surrounding dental extraction site within anterior right maxilla, possibly concerning for osteomyelitis. Antibiotic regimen of ampicillin-sulbactam was initiated on admission. Oral surgery and dentistry services were unavailable for consultation. Successfully contacted ENT who suggested outpatient orofacial surgery evaluation. Infectious disease team is recommending six-week course of Augmentin and have arranged outpatient follow-up on 6-6. Discharged with Augmentin and oxycodone for pain relief. Contact information for several local orofacial surgeons were provided to the patient upon discharge.   ---Hypertension Patient has history of hypertension managed at home with amlodipine. Blood pressure has remained elevated since admission. Recommend outpatient evaluation for consideration of additional antihypertensive agent.     ---Chronic obstructive pulmonary disease Patient has history of  chronic obstructive pulmonary disease managed at home with budesonide-formoterol daily and albuterol as needed. Upon arrival, patient breathing comfortably without supplemental oxygen. Denies breath shortness, cough, chest pain, and any recent changes in respiratory status. Mometasone-formoterol was initiated on admission and continued upon discharge.     ---Tobacco use Patient has history of tobacco use and currently smokes about 0.5ppd. Expressed interest in nicotine patches while hospitalized and is open to medication therapy for assistance with smoking cessation. Recommend providing assistance with smoking cessation including medications.        Discharge Exam:    BP (!) 145/95 (BP Location: Right Arm)   Pulse 61   Temp 97.7 F (36.5 C) (Oral)   Resp 19   Ht 5\' 9"  (1.753 m)   Wt 108.9 kg   SpO2 100%   BMI 35.44 kg/m   Subjective: [name] was feeling okay this morning, pain is about the same as yesterday but pain medications have been helping. Denies fever, chills, sweats, and myalgias. Discussed plan to continue antibiotics and coordinate care with infectious disease plus orofacial surgery teams. All questions were answered.  General:                       awake and alert, lying in bed, cooperative, not in acute distress Skin:  warm and dry, intact without any obvious lesions or scars, no rashes Head:                           normocephalic and atraumatic, oral mucosa moist with poor dentition, most teeth missing from right maxilla, gum erosion overlying yellowish-white structure consistent with bone Eyes:                            extraocular movements intact, pupils round Lungs:                          normal respiratory effort, breathing unlabored, symmetrical chest rise, no crackles or wheezing Cardiac:                        regular rate and rhythm, normal S1 and S2, no pitting edema Neurologic:                   oriented to  person-place-time, moving all extremities, no gross focal deficits Psychiatric:                   euthymic mood with congruent affect, intelligible speech     Pertinent Labs, Studies, and Procedures:   Labs:     Latest Ref Rng & Units 03/31/2023    3:26 AM 03/30/2023   11:37 AM 04/19/2020    3:30 PM  CBC  WBC 4.0 - 10.5 K/uL 10.7  13.6  10.5   Hemoglobin 13.0 - 17.0 g/dL 19.1  47.8  29.5   Hematocrit 39.0 - 52.0 % 37.2  41.5  38.0   Platelets 150 - 400 K/uL 265  287  290       Latest Ref Rng & Units 03/31/2023    3:26 AM 03/30/2023   11:37 AM 04/19/2020    3:30 PM  CMP  Glucose 70 - 99 mg/dL 621  96  94   BUN 8 - 23 mg/dL 18  18  17    Creatinine 0.61 - 1.24 mg/dL 3.08  6.57  8.46   Sodium 135 - 145 mmol/L 137  138  141   Potassium 3.5 - 5.1 mmol/L 3.8  3.8  4.5   Chloride 98 - 111 mmol/L 108  108  109   CO2 22 - 32 mmol/L 20  24  24    Calcium 8.9 - 10.3 mg/dL 8.7  9.4  8.8     ______________________  Imaging:  CT MAXILLOFACIAL W CONTRAST Result Date: 03/30/2023 IMPRESSION: 1. 1.4 x 1.0 x 1.2 cm irregular focus of lucency (with some peripheral hyperostosis) surrounding a dental extraction site within the anterior right maxilla. This may reflect chronic sequelae of prior dental disease and/or chronic osteomyelitis. Ill-defined hyperattenuation within the overlying perioral soft tissue suspicious for phlegmon. No soft tissue abscess is identified. 2. Mild, diffuse paranasal sinus mucosal thickening.   ______________________  Procedures:  none  ______________________   Discharge Instructions:  [name],  It was a pleasure taking care of you while you were in the hospital. Your mouth pain is caused by an infection that has persisted despite multiple tooth extractions and antibiotic regimens. We gave you a strong antibiotic called Augmentin that you will continue to take at home for the next six weeks. This antibiotic will help treat your infection, but you may  ultimately need to have an  orofacial surgeon remove the infected bone. Unfortunately, [facility] cannot provide this service for you but here are a few local options:     - [facility] [phone])    - [facility] [phone])    - [facility] [phone])  We have scheduled a follow-up visit with our infectious disease team for 04-08-2023 at 9:30am. It is important that you continue to take the Augmentin daily and meet with our infectious disease team. Additionally, we are sending you home with some oxycodone for pain relief.  Please return to the emergency department if your pain worsens significantly or if you develop fever, chills, and weakness.   All the best,  Signed: Lajuana Ripple, MD Internal Medicine PGY-1 Pager [phone]

## 2023-03-31 NOTE — Progress Notes (Signed)
Chapalin returned to PT room to help with AD paerwork and PT finished it.  Chaplain attempted to find notary but both departmental notaries are currently out of the office.  Isaac Contreras understands that PT is discharging soon and is attempting to locate another notary for signing.

## 2023-03-31 NOTE — TOC Transition Note (Signed)
Transition of Care [facility]) - CM/SW Discharge Note   Patient Details  Name: Isaac Contreras. MRN: [id number] Date of Birth: 1956/09/12  [facility] CM/SW Contact:  Harriet Masson, RN Phone Number: 03/31/2023, 3:57 PM   Clinical Narrative:     Patient stable for discharge.  No TOC needs at this time.   Final next level of care: Home/Self Care Barriers to Discharge: Barriers Resolved   Patient Goals and CMS Choice     Return home Discharge Placement                 home        Discharge Plan and Services Additional resources added to the After Visit Summary for                                       Social Determinants of Health (SDOH) Interventions SDOH Screenings   Food Insecurity: No Food Insecurity (03/30/2023)  Housing: Low Risk  (03/30/2023)  Transportation Needs: No Transportation Needs (03/30/2023)  Utilities: Not At Risk (03/30/2023)  Tobacco Use: Medium Risk (03/30/2023)     Readmission Risk Interventions     No data to display

## 2023-03-31 NOTE — Progress Notes (Signed)
Pt discharged with family at bedside. Educated on AVS. Informed him on resources for his advanced directive at home.

## 2023-04-01 ENCOUNTER — Ambulatory Visit
Admission: RE | Admit: 2023-04-01 | Discharge: 2023-04-01 | Disposition: A | Discharge status: Home / Self Care | Payer: Medicare HMO | Source: Ambulatory Visit | Attending: Nurse Practitioner | Admitting: Nurse Practitioner

## 2023-04-01 DIAGNOSIS — F17209 Nicotine dependence, unspecified, with unspecified nicotine-induced disorders: Secondary | ICD-10-CM

## 2023-04-08 ENCOUNTER — Ambulatory Visit: Payer: Medicare HMO | Admitting: Internal Medicine

## 2024-01-25 ENCOUNTER — Encounter (HOSPITAL_COMMUNITY): Payer: Self-pay | Admitting: *Deleted

## 2024-01-25 ENCOUNTER — Ambulatory Visit (HOSPITAL_COMMUNITY): Admission: EM | Admit: 2024-01-25 | Discharge: 2024-01-25 | Disposition: A | Discharge status: Home / Self Care

## 2024-01-25 DIAGNOSIS — H6691 Otitis media, unspecified, right ear: Secondary | ICD-10-CM | POA: Diagnosis not present

## 2024-01-25 DIAGNOSIS — K0889 Other specified disorders of teeth and supporting structures: Secondary | ICD-10-CM

## 2024-01-25 DIAGNOSIS — R6884 Jaw pain: Secondary | ICD-10-CM | POA: Diagnosis not present

## 2024-01-25 MED ORDER — AMOXICILLIN-POT CLAVULANATE 875-125 MG PO TABS: 1.0000 | ORAL_TABLET | Freq: Two times a day (BID) | ORAL | 0 refills | Status: AC

## 2024-01-25 NOTE — Discharge Instructions (Signed)
 Take Augmentin as prescribed every 12 hours for the next 7 days.  This antibiotic will treat presumed bone infection to the tooth as well as right ear infection.  Please schedule an appointment with dental surgeon as recommended in [facility].  If you develop any new or worsening symptoms or if your symptoms do not start to improve, please return here or follow-up with your primary care provider. If your symptoms are severe, please go to the emergency room.

## 2024-01-25 NOTE — ED Triage Notes (Signed)
 Pt states he has ear fullness and can't hear out of his right ear X 1 week.   Pt states he has dental pain which he was an antibiotics back in january but wasn't able to follow up with his dentist. He states dental pain returning.

## 2024-01-25 NOTE — ED Provider Notes (Signed)
 [facility]    CSN: [id number] Arrival date & time: 01/25/24  9528      History   Chief Complaint Chief Complaint  Patient presents with   Ear Fullness    HPI Isaac Contreras. is a 68 y.o. male.   Patient presents to urgent care for evaluation of right ear fullness that started a few days ago.  States hearing from right ear is decreased and he has not noticed any drainage from the ear.  Denies tinnitus, dizziness, viral URI symptoms, fever, chills, nausea, vomiting, and bodyaches.  No recent cough or congestion reported.  Denies recent trauma/injuries to the ear and use of Q-tips.  Additionally reports history of osteomyelitis to the maxilla above the location of tooth 8.  He has received workup for this by dentist who referred him to an oral surgeon in [place].  He was unable to make his appointment with oral surgeon due to family emergency and is now experiencing further dental pain to area of osteomyelitis.  He wears dentures.  Denies overlying redness/swelling to the face.  No recent antibiotic use in the last 90 days reported.  He has not attempted use of any over-the-counter medications to help with symptoms PTA.   Ear Fullness    Past Medical History:  Diagnosis Date   Acute MI (HCC) 11/02/1992   reports prior MI in [place]   Arthritis    Asthma    Bronchitis    Diabetes (HCC)    HLD (hyperlipidemia)    Hx of adenomatous colonic polyps 02/22/2016   Hypertension    Tobacco abuse     Patient Active Problem List   Diagnosis Date Noted   Osteomyelitis (HCC) 03/31/2023   Dental infection 03/30/2023   Hx of adenomatous colonic polyps 02/22/2016   Chest pain 02/05/2014   Dyspnea 02/05/2014   Hypertension    Diabetes mellitus without complication (HCC)    Acute MI (HCC)    Bronchitis     Past Surgical History:  Procedure Laterality Date   APPENDECTOMY     COLONOSCOPY  09/07/2008   [place], [place] tics, hems   cyst removed off back     I & D  EXTREMITY Right 04/19/2020   Procedure: IRRIGATION AND DEBRIDEMENT EXTREMITY;  Surgeon: Dominica Severin, MD;  Location: [facility] OR;  Service: Orthopedics;  Laterality: Right;       Home Medications    Prior to Admission medications   Medication Sig Start Date End Date Taking? Authorizing Provider  amLODipine (NORVASC) 10 MG tablet Take 10 mg by mouth daily.   Yes [provider]  amoxicillin-clavulanate (AUGMENTIN) 875-125 MG tablet Take 1 tablet by mouth every 12 (twelve) hours. 01/25/24  Yes Carlisle Beers, FNP  aspirin 81 MG tablet Take 81 mg by mouth daily.   Yes [provider]  budesonide-formoterol (SYMBICORT) 80-4.5 MCG/ACT inhaler Inhale 2 puffs into the lungs in the morning and at bedtime. 10/05/21  Yes Raspet, Erin K, PA-C  isosorbide mononitrate (IMDUR) 30 MG 24 hr tablet Take 30 mg by mouth daily. 12/28/19  Yes [provider]  rosuvastatin (CRESTOR) 10 MG tablet Take 1 tablet (10 mg total) by mouth daily. 03/14/20  Yes Couture, Cortni S, PA-C  testosterone cypionate (DEPOTESTOSTERONE CYPIONATE) 200 MG/ML injection Inject 200 mg into the muscle every 14 (fourteen) days. 12/30/23   [provider]  albuterol (PROVENTIL HFA;VENTOLIN HFA) 108 (90 Base) MCG/ACT inhaler Inhale 2 puffs into the lungs every 6 (six) hours as  needed for wheezing. 11/21/15   Wallis Bamberg, PA-C  chlorhexidine (PERIDEX) 0.12 % solution Use as directed 15 mLs in the mouth or throat 4 (four) times daily. 03/04/23   [provider]  glucose blood (ACCU-CHEK AVIVA) test strip Test glucose with fingerstick once daily.  Diag: E11.9 Patient taking differently: 1 each by Other route See admin instructions. Test glucose with fingerstick once daily.  Diag: E11.9 05/21/16   Wallis Bamberg, PA-C  ibuprofen (ADVIL) 800 MG tablet Take 800 mg by mouth every 6 (six) hours as needed for moderate pain. 03/04/23   [provider]  Lancets (ACCU-CHEK SAFE-T PRO) lancets Test glucose with  fingerstick once daily. Diag: E11.9 Patient taking differently: 1 each by Other route See admin instructions. Test glucose with fingerstick once daily. Diag: E11.9 05/21/16   Wallis Bamberg, PA-C  Lancets MISC Check blood sugar three times daily. Patient taking differently: 1 each by Other route See admin instructions. Check blood sugar three times daily. 01/16/16   Wallis Bamberg, PA-C  nicotine (NICODERM CQ - DOSED IN MG/24 HR) 7 mg/24hr patch Place 1 patch (7 mg total) onto the skin daily. 04/01/23   Crissie Sickles, MD  omeprazole (PRILOSEC) 40 MG capsule Take 40 mg by mouth daily. 02/26/23   [provider]    Family History Family History  Problem Relation Age of Onset   Cancer Mother    Colon cancer Neg Hx    Colon polyps Neg Hx    Esophageal cancer Neg Hx    Stomach cancer Neg Hx    Rectal cancer Neg Hx     Social History Social History   Tobacco Use   Smoking status: Former    Current packs/day: 0.00    Average packs/day: 0.5 packs/day for 15.0 years (7.5 ttl pk-yrs)    Types: Cigarettes    Start date: 11/26/2000    Quit date: 11/27/2015    Years since quitting: 8.1   Smokeless tobacco: Never  Vaping Use   Vaping status: Never Used  Substance Use Topics   Alcohol use: No   Drug use: No     Allergies   Patient has no known allergies.   Review of Systems Review of Systems Per HPI  Physical Exam Triage Vital Signs ED Triage Vitals  Encounter Vitals Group     BP 01/25/24 0823 137/87     Systolic BP Percentile --      Diastolic BP Percentile --      Pulse Rate 01/25/24 0823 81     Resp 01/25/24 0823 18     Temp 01/25/24 0823 97.7 F (36.5 C)     Temp Source 01/25/24 0823 Oral     SpO2 01/25/24 0823 98 %     Weight --      Height --      Head Circumference --      Peak Flow --      Pain Score 01/25/24 0821 7     Pain Loc --      Pain Education --      Exclude from Growth Chart --    No data found.  Updated Vital Signs BP 137/87 (BP Location:  Right Arm)   Pulse 81   Temp 97.7 F (36.5 C) (Oral)   Resp 18   SpO2 98%   Visual Acuity Right Eye Distance:   Left Eye Distance:   Bilateral Distance:    Right Eye Near:   Left Eye Near:    Bilateral  Near:     Physical Exam Vitals and nursing note reviewed.  Constitutional:      Appearance: He is not ill-appearing or toxic-appearing.  HENT:     Head: Normocephalic and atraumatic.     Right Ear: External ear normal. Decreased hearing noted. Swelling and tenderness present. Tympanic membrane is erythematous and bulging. Tympanic membrane is not perforated.     Left Ear: Hearing, tympanic membrane, ear canal and external ear normal.     Nose: Nose normal.     Mouth/Throat:     Lips: Pink.     Mouth: Mucous membranes are moist. No injury or oral lesions.     Dentition: Abnormal dentition. Has dentures. No dental abscesses.     Tongue: No lesions.     Pharynx: Oropharynx is clear. Uvula midline. No pharyngeal swelling, oropharyngeal exudate, posterior oropharyngeal erythema, uvula swelling or postnasal drip.     Tonsils: No tonsillar exudate.   Eyes:     General: Lids are normal. Vision grossly intact. Gaze aligned appropriately.     Extraocular Movements: Extraocular movements intact.     Conjunctiva/sclera: Conjunctivae normal.  Neck:     Trachea: Trachea and phonation normal.  Pulmonary:     Effort: Pulmonary effort is normal.  Musculoskeletal:     Cervical back: Neck supple.  Lymphadenopathy:     Cervical: Cervical adenopathy present.  Skin:    General: Skin is warm and dry.     Capillary Refill: Capillary refill takes less than 2 seconds.     Findings: No rash.  Neurological:     General: No focal deficit present.     Mental Status: He is alert and oriented to person, place, and time. Mental status is at baseline.     Cranial Nerves: No dysarthria or facial asymmetry.  Psychiatric:        Mood and Affect: Mood normal.        Speech: Speech normal.         Behavior: Behavior normal.        Thought Content: Thought content normal.        Judgment: Judgment normal.      UC Treatments / Results  Labs (all labs ordered are listed, but only abnormal results are displayed) Labs Reviewed - No data to display  EKG   Radiology No results found.  Procedures Procedures (including critical care time)  Medications Ordered in UC Medications - No data to display  Initial Impression / Assessment and Plan / UC Course  I have reviewed the triage vital signs and the nursing notes.  Pertinent labs & imaging results that were available during my care of the patient were reviewed by me and considered in my medical decision making (see chart for details).   1.  Dental pain, right otitis media Right AOM on exam.  History of maxillary osteomyelitis.  Suspect this has become reinfected as patient did not follow-up with his oral surgeon as scheduled due to family emergency. Advised to call oral surgeon to schedule an appointment for follow-up.  Will treat with Augmentin twice daily for 7 days today to treat both presumed flareup of osteomyelitis to the maxilla and right AOM.  No signs of systemic infection currently or red flags indicating need for referral to the ER.   Counseled patient on potential for adverse effects with medications prescribed/recommended today, strict ER and return-to-clinic precautions discussed, patient verbalized understanding.    Final Clinical Impressions(s) / UC Diagnoses   Final diagnoses:  Pain, dental  Right otitis media, unspecified otitis media type  Maxillary pain     Discharge Instructions      Take Augmentin as prescribed every 12 hours for the next 7 days.  This antibiotic will treat presumed bone infection to the tooth as well as right ear infection.  Please schedule an appointment with dental surgeon as recommended in [facility].  If you develop any new or worsening symptoms or if your symptoms do  not start to improve, please return here or follow-up with your primary care provider. If your symptoms are severe, please go to the emergency room.    ED Prescriptions     Medication Sig Dispense Auth. Provider   amoxicillin-clavulanate (AUGMENTIN) 875-125 MG tablet Take 1 tablet by mouth every 12 (twelve) hours. 14 tablet Carlisle Beers, FNP      PDMP not reviewed this encounter.   Carlisle Beers, [place] 01/25/24 [phone]

## 2024-02-21 ENCOUNTER — Other Ambulatory Visit: Payer: Self-pay | Admitting: Nurse Practitioner

## 2024-02-21 DIAGNOSIS — F17200 Nicotine dependence, unspecified, uncomplicated: Secondary | ICD-10-CM

## 2024-04-03 ENCOUNTER — Ambulatory Visit
Admission: RE | Admit: 2024-04-03 | Discharge: 2024-04-03 | Disposition: A | Discharge status: Home / Self Care | Source: Ambulatory Visit | Attending: Nurse Practitioner | Admitting: Nurse Practitioner

## 2024-04-03 DIAGNOSIS — F17200 Nicotine dependence, unspecified, uncomplicated: Secondary | ICD-10-CM

## 2024-11-16 ENCOUNTER — Other Ambulatory Visit: Payer: Self-pay | Admitting: Urology

## 2024-11-16 DIAGNOSIS — R972 Elevated prostate specific antigen [PSA]: Secondary | ICD-10-CM

## 2024-12-16 ENCOUNTER — Other Ambulatory Visit
